# Patient Record
Sex: Male | Born: 1970
Health system: Southern US, Community
[De-identification: ages and names within clinical notes are randomized; demographics above are authoritative.]

## PROBLEM LIST (undated history)

## (undated) DIAGNOSIS — I10 Essential (primary) hypertension: Secondary | ICD-10-CM

## (undated) DIAGNOSIS — E785 Hyperlipidemia, unspecified: Secondary | ICD-10-CM

## (undated) DIAGNOSIS — E119 Type 2 diabetes mellitus without complications: Secondary | ICD-10-CM

## (undated) HISTORY — DX: Hyperlipidemia, unspecified: E78.5

## (undated) HISTORY — DX: Essential (primary) hypertension: I10

---

## 1998-12-20 ENCOUNTER — Emergency Department (HOSPITAL_COMMUNITY): Admission: EM | Admit: 1998-12-20 | Discharge: 1998-12-20 | Payer: Self-pay | Admitting: Emergency Medicine

## 1998-12-20 ENCOUNTER — Encounter: Payer: Self-pay | Admitting: Emergency Medicine

## 2001-03-19 ENCOUNTER — Emergency Department (HOSPITAL_COMMUNITY): Admission: EM | Admit: 2001-03-19 | Discharge: 2001-03-20 | Payer: Self-pay | Admitting: Emergency Medicine

## 2003-03-21 ENCOUNTER — Emergency Department (HOSPITAL_COMMUNITY): Admission: EM | Admit: 2003-03-21 | Discharge: 2003-03-21 | Payer: Self-pay | Admitting: Emergency Medicine

## 2008-03-23 ENCOUNTER — Emergency Department (HOSPITAL_COMMUNITY): Admission: EM | Admit: 2008-03-23 | Discharge: 2008-03-23 | Payer: Self-pay | Admitting: Emergency Medicine

## 2008-11-08 ENCOUNTER — Emergency Department (HOSPITAL_COMMUNITY): Admission: EM | Admit: 2008-11-08 | Discharge: 2008-11-08 | Payer: Self-pay | Admitting: Emergency Medicine

## 2009-01-28 ENCOUNTER — Emergency Department (HOSPITAL_COMMUNITY): Admission: EM | Admit: 2009-01-28 | Discharge: 2009-01-28 | Payer: Self-pay | Admitting: Emergency Medicine

## 2011-02-16 LAB — URINE MICROSCOPIC-ADD ON

## 2011-02-16 LAB — COMPREHENSIVE METABOLIC PANEL
ALT: 20 U/L (ref 0–53)
AST: 21 U/L (ref 0–37)
Albumin: 3.9 g/dL (ref 3.5–5.2)
Alkaline Phosphatase: 136 U/L — ABNORMAL HIGH (ref 39–117)
BUN: 12 mg/dL (ref 6–23)
CO2: 24 mEq/L (ref 19–32)
Calcium: 9.1 mg/dL (ref 8.4–10.5)
Chloride: 104 mEq/L (ref 96–112)
Creatinine, Ser: 0.92 mg/dL (ref 0.4–1.5)
GFR calc Af Amer: >60 mL/min (ref >60)
GFR calc non Af Amer: >60 mL/min (ref >60)
Glucose, Bld: 117 mg/dL — ABNORMAL HIGH (ref 70–99)
Potassium: 3.7 mEq/L (ref 3.5–5.1)
Sodium: 139 mEq/L (ref 135–145)
Total Bilirubin: 0.6 mg/dL (ref 0.3–1.2)
Total Protein: 6.7 g/dL (ref 6.0–8.3)

## 2011-02-16 LAB — DIFFERENTIAL
Basophils Absolute: 0 10*3/uL (ref 0.0–0.1)
Basophils Relative: 0 % (ref 0–1)
Eosinophils Absolute: 0.3 10*3/uL (ref 0.0–0.7)
Eosinophils Relative: 3 % (ref 0–5)
Lymphocytes Relative: 27 % (ref 12–46)
Lymphs Abs: 2.4 10*3/uL (ref 0.7–4.0)
Monocytes Absolute: 0.4 10*3/uL (ref 0.1–1.0)
Monocytes Relative: 4 % (ref 3–12)
Neutro Abs: 5.8 10*3/uL (ref 1.7–7.7)
Neutrophils Relative %: 65 % (ref 43–77)

## 2011-02-16 LAB — MONONUCLEOSIS SCREEN: Mono Screen: NEGATIVE

## 2011-02-16 LAB — LIPASE, BLOOD: Lipase: 20 U/L (ref 11–59)

## 2011-02-16 LAB — URINALYSIS, ROUTINE W REFLEX MICROSCOPIC
Bilirubin Urine: NEGATIVE
Glucose, UA: NEGATIVE mg/dL
Hgb urine dipstick: NEGATIVE
Nitrite: NEGATIVE
Protein, ur: NEGATIVE mg/dL
Specific Gravity, Urine: 1.026 (ref 1.005–1.030)
Urobilinogen, UA: 1 mg/dL (ref 0.0–1.0)
pH: 6 (ref 5.0–8.0)

## 2011-02-16 LAB — CBC
HCT: 42.8 % (ref 39.0–52.0)
Hemoglobin: 14.5 g/dL (ref 13.0–17.0)
MCHC: 33.8 g/dL (ref 30.0–36.0)
MCV: 88.8 fL (ref 78.0–100.0)
Platelets: 277 10*3/uL (ref 150–400)
RBC: 4.82 MIL/uL (ref 4.22–5.81)
RDW: 13.8 % (ref 11.5–15.5)
WBC: 8.9 10*3/uL (ref 4.0–10.5)

## 2017-08-27 ENCOUNTER — Emergency Department (HOSPITAL_BASED_OUTPATIENT_CLINIC_OR_DEPARTMENT_OTHER)
Admission: EM | Admit: 2017-08-27 | Discharge: 2017-08-27 | Disposition: A | Discharge status: Home / Self Care | Payer: Self-pay | Attending: Emergency Medicine | Admitting: Emergency Medicine

## 2017-08-27 ENCOUNTER — Encounter (HOSPITAL_BASED_OUTPATIENT_CLINIC_OR_DEPARTMENT_OTHER): Payer: Self-pay | Admitting: Emergency Medicine

## 2017-08-27 DIAGNOSIS — L03317 Cellulitis of buttock: Secondary | ICD-10-CM | POA: Insufficient documentation

## 2017-08-27 DIAGNOSIS — F17200 Nicotine dependence, unspecified, uncomplicated: Secondary | ICD-10-CM | POA: Insufficient documentation

## 2017-08-27 DIAGNOSIS — E119 Type 2 diabetes mellitus without complications: Secondary | ICD-10-CM | POA: Insufficient documentation

## 2017-08-27 HISTORY — DX: Type 2 diabetes mellitus without complications: E11.9

## 2017-08-27 MED ORDER — DOXYCYCLINE HYCLATE 100 MG PO CAPS: 100.0000 mg | ORAL_CAPSULE | Freq: Two times a day (BID) | ORAL | 0 refills | Status: DC

## 2017-08-27 MED FILL — DOXYCYCLINE HYCLATE 100 MG: 100 | 7 days supply | Qty: 14 | Fill #0

## 2017-08-27 NOTE — ED Triage Notes (Signed)
Pt has abscess to left buttocks for several days.  No known fever at home.

## 2017-08-27 NOTE — ED Provider Notes (Signed)
[facility] EMERGENCY DEPARTMENT Provider Note   CSN: [id number] Arrival date & time: 08/27/17  8295     History   Chief Complaint Chief Complaint  Patient presents with  . Abscess    HPI Larry Moody is a 46 y.o. male.  HPI 46 year old Caucasian male past medical history significant for diabetes presents to the ED for evaluation of abscess to left buttocks.  The patient states that the area started approximately 3 days ago and has become more painful and grown in size with associated erythema and warmth.  Patient states he may have been bitten by a spider.  Patient denies any purulent drainage.  Area is tender to touch.  Has been treating with Motrin and Tylenol with relief.  Also states that he put salve on it to help bring it to a "head".  Patient denies any associated symptoms of fever, chills, abdominal pain, nausea, emesis, pain with defecation, hematochezia.  No history of abscesses.  Patient does report being diagnosed with diabetes approximately 6 years ago but it was taken off his medication approximately 5 years ago. Past Medical History:  Diagnosis Date  . Diabetes mellitus without complication (HCC)     There are no active problems to display for this patient.   No past surgical history on file.     Home Medications    Prior to Admission medications   Not on File    Family History No family history on file.  Social History Social History  Substance Use Topics  . Smoking status: Current Every Day Smoker  . Smokeless tobacco: Never Used  . Alcohol use Not on file     Allergies   Other   Review of Systems Review of Systems  Constitutional: Negative for chills and fever.  Gastrointestinal: Negative for abdominal pain, blood in stool, nausea, rectal pain and vomiting.  Musculoskeletal: Positive for myalgias.  Skin: Positive for wound.     Physical Exam Updated Vital Signs BP (!) 147/87 (BP Location: Left Arm)   Pulse (!) 110    Temp 99.1 F (37.3 C) (Oral)   Resp 20   Ht 6\' 3"  (1.905 m)   Wt (!) 142.9 kg (315 lb)   SpO2 98%   BMI 39.37 kg/m   Physical Exam  Constitutional: He appears well-developed and well-nourished. No distress.  HENT:  Head: Normocephalic and atraumatic.  Eyes: Right eye exhibits no discharge. Left eye exhibits no discharge. No scleral icterus.  Neck: Normal range of motion.  Pulmonary/Chest: No respiratory distress.  Abdominal: Soft. There is no rebound and no guarding.  Musculoskeletal: Normal range of motion.  Neurological: He is alert.  Skin: Skin is warm and dry. Capillary refill takes less than 2 seconds. No pallor.  Patient with approximately 4 x 4 centimeter area of erythema, warmth and induration to the left buttocks.  There is a central area of ulceration but no area of fluctuance.  No purulent drainage.  Area is mildly tender to touch.  There is no rectal involvement or perirectal involvement.  No crepitus noted.  No scrotal involvement.  Psychiatric: His behavior is normal. Judgment and thought content normal.  Nursing note and vitals reviewed.    ED Treatments / Results  Labs (all labs ordered are listed, but only abnormal results are displayed) Labs Reviewed - No data to display  EKG  EKG Interpretation None       Radiology No results found.  Procedures Procedures (including critical care time)  Medications Ordered  in ED Medications - No data to display   Initial Impression / Assessment and Plan / ED Course  I have reviewed the triage vital signs and the nursing notes.  Pertinent labs & imaging results that were available during my care of the patient were reviewed by me and considered in my medical decision making (see chart for details).     Patient presentation consistent with cellulitis.  Patient concerned that he may be have bitten by spider.  Afebrile however low grade temperature. Mild tachycardia. No hypotension or other symptoms suggestive of  severe infection.  Patient does have a history of diabetes but states he was taken off his medication 5 years ago.  Bedside ultrasound was used that showed no area of drainable abscess at this time.  Patient denies any associated systemic symptoms of nausea, emesis, abdominal pain, fever.  Patient denies any pain with defecation.  There seems to be no perirectal or rectal involvement L be concerning for perirectal abscess.  pt advised to follow up for wound check in 2-3 days, sooner for worsening systemic symptoms, new lymphangitis, or significant spread of erythema past line of demarcation. Will discharge with doxycyline. Return precautions discussed. Pt appears safe for discharge.    Final Clinical Impressions(s) / ED Diagnoses   Final diagnoses:  Cellulitis of buttock    New Prescriptions Discharge Medication List as of 08/27/2017  9:50 AM    START taking these medications   Details  doxycycline (VIBRAMYCIN) 100 MG capsule Take 1 capsule (100 mg total) by mouth 2 (two) times daily., Starting Fri 08/27/2017, Print         Rise MuLeaphart, Allyson Tineo T, PA-C 08/27/17 [id number]    Vanetta MuldersZackowski, Scott, MD 08/31/17 [phone]

## 2017-08-27 NOTE — Discharge Instructions (Signed)
You have been seen today in the Emergency Department for cellulitis, a superficial skin infection. Please take your antibiotics as prescribed for their ENTIRE prescribed duration.   May alternate between motrin and tylenol for fever, pain, and swelling.  Please follow up with your doctor or return to the ER in 2 days for recheck of your infection if you are not improving.  Call your doctor sooner or return to the ER if you develop worsening signs of infection such as: increased redness, increased pain, pus, fever, or other symptoms that concern you.  

## 2017-08-27 NOTE — ED Notes (Signed)
ED Provider at bedside. 

## 2018-12-30 ENCOUNTER — Ambulatory Visit: Payer: Self-pay | Admitting: Family Medicine

## 2019-05-30 ENCOUNTER — Encounter: Payer: Self-pay | Admitting: Registered Nurse

## 2019-05-30 ENCOUNTER — Other Ambulatory Visit: Payer: Self-pay

## 2019-05-30 ENCOUNTER — Ambulatory Visit (INDEPENDENT_AMBULATORY_CARE_PROVIDER_SITE_OTHER): Payer: BLUE CROSS/BLUE SHIELD | Admitting: Registered Nurse

## 2019-05-30 VITALS — BP 140/90 | HR 94 | Temp 99.0°F | Resp 13 | Ht 73.82 in | Wt 312.0 lb

## 2019-05-30 DIAGNOSIS — Z130 Encounter for screening for diseases of the blood and blood-forming organs and certain disorders involving the immune mechanism: Secondary | ICD-10-CM | POA: Diagnosis not present

## 2019-05-30 DIAGNOSIS — Z13220 Encounter for screening for lipoid disorders: Secondary | ICD-10-CM

## 2019-05-30 DIAGNOSIS — Z1329 Encounter for screening for other suspected endocrine disorder: Secondary | ICD-10-CM | POA: Diagnosis not present

## 2019-05-30 DIAGNOSIS — Z0001 Encounter for general adult medical examination with abnormal findings: Secondary | ICD-10-CM

## 2019-05-30 DIAGNOSIS — Z13228 Encounter for screening for other metabolic disorders: Secondary | ICD-10-CM

## 2019-05-30 DIAGNOSIS — E119 Type 2 diabetes mellitus without complications: Secondary | ICD-10-CM

## 2019-05-30 MED ORDER — METFORMIN HCL 500 MG PO TABS
500.0000 mg | ORAL_TABLET | Freq: Two times a day (BID) | ORAL | 0 refills | Status: DC
Start: 2019-05-30 — End: 2019-07-14

## 2019-05-30 NOTE — Patient Instructions (Signed)
° ° ° °  If you have lab work done today you will be contacted with your lab results within the next 2 weeks.  If you have not heard from us then please contact us. The fastest way to get your results is to register for My Chart. ° ° °IF you received an x-ray today, you will receive an invoice from [facility]. Please contact [facility] at [phone] with questions or concerns regarding your invoice.  ° °IF you received labwork today, you will receive an invoice from [facility]. Please contact [facility] at [phone] with questions or concerns regarding your invoice.  ° °Our billing staff will not be able to assist you with questions regarding bills from these companies. ° °You will be contacted with the lab results as soon as they are available. The fastest way to get your results is to activate your My Chart account. Instructions are located on the last page of this paperwork. If you have not heard from us regarding the results in 2 weeks, please contact this office. °  ° ° ° °

## 2019-05-30 NOTE — Progress Notes (Signed)
New Patient Office Visit  Subjective:  Patient ID: Larry Moody, male    DOB: [birth year]  Age: 48 y.o. MRN: [id number]  CC:  Chief Complaint  Patient presents with  . Establish Care    HPI DECOREY Moody presents for visit to establish care.  He has a history of T2DM, but after losing 150lbs, was able to stop his medication. Unfortunately, after that, he was lost to follow up and has not been seen by a PCP since. He hopes to reestablish care with lab work and physical today.  He feels overall fine, but has been taking his blood sugar at home and states that it usually ranges from 180-230, with highs of around 370.   Has recently experienced weight gain - has only been working 1 of his 2 jobs d/t COVID.  Past Medical History:  Diagnosis Date  . Diabetes mellitus without complication ([place])   . Hypertension     History reviewed. No pertinent surgical history.  Family History  Problem Relation Age of Onset  . Hypertension Mother   . Hypertension Father   . Diabetes Brother     Social History   Socioeconomic History  . Marital status: Married    Spouse name: Not on file  . Number of children: 6  . Years of education: Not on file  . Highest education level: Not on file  Occupational History  . Not on file  Social Needs  . Financial resource strain: Not hard at all  . Food insecurity    Worry: Never true    Inability: Never true  . Transportation needs    Medical: No    Non-medical: No  Tobacco Use  . Smoking status: Current Every Day Smoker    Packs/day: 0.50  . Smokeless tobacco: Never Used  Substance and Sexual Activity  . Alcohol use: Yes  . Drug use: Not Currently  . Sexual activity: Yes  Lifestyle  . Physical activity    Days per week: Patient refused    Minutes per session: Patient refused  . Stress: To some extent  Relationships  . Social connections    Talks on phone: More than three times a week    Gets together: Three times a week   Attends religious service: Patient refused    Active member of club or organization: Patient refused    Attends meetings of clubs or organizations: Patient refused    Relationship status: Married  . Intimate partner violence    Fear of current or ex partner: No    Emotionally abused: No    Physically abused: No    Forced sexual activity: No  Other Topics Concern  . Not on file  Social History Narrative  . Not on file    ROS Review of Systems  Constitutional: Positive for fatigue. Negative for unexpected weight change.  HENT: Negative.   Eyes: Negative.  Negative for visual disturbance.  Respiratory: Negative.  Negative for cough, chest tightness and shortness of breath.   Cardiovascular: Negative.  Negative for chest pain, palpitations and leg swelling.  Gastrointestinal: Negative.  Negative for constipation, diarrhea, nausea and vomiting.  Endocrine: Negative.   Genitourinary: Negative.   Musculoskeletal: Negative.  Negative for arthralgias and myalgias.  Skin: Negative.  Negative for color change, pallor, rash and wound.  Allergic/Immunologic: Negative.   Neurological: Negative.  Negative for weakness, numbness and headaches.  Hematological: Negative.   Psychiatric/Behavioral: Negative.   All other systems reviewed and are negative.  Objective:   Today's Vitals: BP 140/90   Pulse 94   Temp 99 F (37.2 C) (Oral)   Resp 13   Ht 6' 1.82" (1.875 m)   Wt (!) 312 lb (141.5 kg)   SpO2 97%   BMI 40.26 kg/m   Physical Exam Vitals signs and nursing note reviewed.  Constitutional:      General: He is not in acute distress.    Appearance: Normal appearance. He is normal weight. He is not ill-appearing, toxic-appearing or diaphoretic.  HENT:     Head: Normocephalic and atraumatic.     Right Ear: Tympanic membrane, ear canal and external ear normal. There is no impacted cerumen.     Left Ear: Tympanic membrane, ear canal and external ear normal. There is no impacted  cerumen.     Nose: Nose normal. No congestion or rhinorrhea.     Mouth/Throat:     Mouth: Mucous membranes are dry.     Pharynx: Oropharynx is clear. No oropharyngeal exudate or posterior oropharyngeal erythema.  Eyes:     General: No scleral icterus.       Right eye: No discharge.        Left eye: No discharge.     Extraocular Movements: Extraocular movements intact.     Conjunctiva/sclera: Conjunctivae normal.     Pupils: Pupils are equal, round, and reactive to light.  Neck:     Musculoskeletal: Normal range of motion.  Cardiovascular:     Rate and Rhythm: Normal rate and regular rhythm.     Pulses: Normal pulses.     Heart sounds: Normal heart sounds. No murmur. No friction rub. No gallop.   Pulmonary:     Effort: Pulmonary effort is normal. No respiratory distress.     Breath sounds: Normal breath sounds. No stridor. No wheezing, rhonchi or rales.  Chest:     Chest wall: No tenderness.  Abdominal:     General: Bowel sounds are normal.     Palpations: Abdomen is soft.  Musculoskeletal: Normal range of motion.        General: No swelling, tenderness, deformity or signs of injury.     Right lower leg: No edema.     Left lower leg: No edema.  Skin:    General: Skin is warm and dry.     Capillary Refill: Capillary refill takes less than 2 seconds.     Coloration: Skin is not jaundiced or pale.     Findings: No bruising, erythema, lesion or rash.  Neurological:     General: No focal deficit present.     Mental Status: He is alert and oriented to person, place, and time. Mental status is at baseline.     Cranial Nerves: No cranial nerve deficit.     Sensory: No sensory deficit.     Motor: No weakness.     Coordination: Coordination normal.  Psychiatric:        Mood and Affect: Mood normal.        Behavior: Behavior normal.        Thought Content: Thought content normal.        Judgment: Judgment normal.     Assessment & Plan:   Problem List Items Addressed This Visit     None    Visit Diagnoses    Screening for endocrine, metabolic and immunity disorder    -  Primary   Relevant Orders   TSH   CMP14+EGFR   Microalbumin, urine   HgB A1c  CBC with Differential   Type 2 diabetes mellitus without complication, without long-term current use of insulin (HCC)       Relevant Orders   TSH   CMP14+EGFR   Microalbumin, urine   HgB A1c   CBC with Differential   Lipid screening       Relevant Orders   Lipid panel      Outpatient Encounter Medications as of 05/30/2019  Medication Sig  . [DISCONTINUED] doxycycline (VIBRAMYCIN) 100 MG capsule Take 1 capsule (100 mg total) by mouth 2 (two) times daily.   No facility-administered encounter medications on file as of 05/30/2019.     Follow-up: No follow-ups on file.   PLAN:  Unfortunately, A1c POCT was 11.0% today. We will start Metformin 512m PO bid with meals for 6 weeks and then follow up. At that point, depending on tolerance, we can continue or switch to a different agent.  Otherwise, labs were drawn. Will follow as warranted.  Discussed nonpharm lifestyle changes to help with [id number] No complications at this time. Will closely monitor. Has not had eye exam. Referral sent.  Patient encouraged to call clinic with any questions, comments, or concerns.    RMaximiano Coss NP

## 2019-05-31 LAB — CMP14+EGFR
ALT: 13 IU/L (ref 0–44)
AST: 14 IU/L (ref 0–40)
Albumin/Globulin Ratio: 1.7 (ref 1.2–2.2)
Albumin: 4.5 g/dL (ref 4.0–5.0)
Alkaline Phosphatase: 179 IU/L — ABNORMAL HIGH (ref 39–117)
BUN/Creatinine Ratio: 14 (ref 9–20)
BUN: 14 mg/dL (ref 6–24)
Bilirubin Total: 0.2 mg/dL (ref 0.0–1.2)
CO2: 21 mmol/L (ref 20–29)
Calcium: 9.6 mg/dL (ref 8.7–10.2)
Chloride: 101 mmol/L (ref 96–106)
Creatinine, Ser: 0.99 mg/dL (ref 0.76–1.27)
GFR calc Af Amer: 103 mL/min/{1.73_m2} (ref >59)
GFR calc non Af Amer: 89 mL/min/{1.73_m2} (ref >59)
Globulin, Total: 2.6 g/dL (ref 1.5–4.5)
Glucose: 208 mg/dL — ABNORMAL HIGH (ref 65–99)
Potassium: 4 mmol/L (ref 3.5–5.2)
Sodium: 141 mmol/L (ref 134–144)
Total Protein: 7.1 g/dL (ref 6.0–8.5)

## 2019-05-31 LAB — CBC WITH DIFFERENTIAL/PLATELET
Basophils Absolute: 0 10*3/uL (ref 0.0–0.2)
Basos: 0 %
EOS (ABSOLUTE): 0.1 10*3/uL (ref 0.0–0.4)
Eos: 1 %
Hematocrit: 39.7 % (ref 37.5–51.0)
Hemoglobin: 13.4 g/dL (ref 13.0–17.7)
Immature Grans (Abs): 0 10*3/uL (ref 0.0–0.1)
Immature Granulocytes: 0 %
Lymphocytes Absolute: 2.8 10*3/uL (ref 0.7–3.1)
Lymphs: 34 %
MCH: 28.9 pg (ref 26.6–33.0)
MCHC: 33.8 g/dL (ref 31.5–35.7)
MCV: 86 fL (ref 79–97)
Monocytes Absolute: 0.4 10*3/uL (ref 0.1–0.9)
Monocytes: 6 %
Neutrophils Absolute: 4.7 10*3/uL (ref 1.4–7.0)
Neutrophils: 59 %
Platelets: 299 10*3/uL (ref 150–450)
RBC: 4.63 x10E6/uL (ref 4.14–5.80)
RDW: 13.1 % (ref 11.6–15.4)
WBC: 8.1 10*3/uL (ref 3.4–10.8)

## 2019-05-31 LAB — LIPID PANEL
Chol/HDL Ratio: 9.5 ratio — ABNORMAL HIGH (ref 0.0–5.0)
Cholesterol, Total: 285 mg/dL — ABNORMAL HIGH (ref 100–199)
HDL: 30 mg/dL — ABNORMAL LOW (ref >39)
LDL Calculated: 203 mg/dL — ABNORMAL HIGH (ref 0–99)
Triglycerides: 259 mg/dL — ABNORMAL HIGH (ref 0–149)
VLDL Cholesterol Cal: 52 mg/dL — ABNORMAL HIGH (ref 5–40)

## 2019-05-31 LAB — MICROALBUMIN, URINE: Microalbumin, Urine: 31.3 ug/mL

## 2019-05-31 LAB — TSH: TSH: 1.87 u[IU]/mL (ref 0.450–4.500)

## 2019-06-02 ENCOUNTER — Encounter: Payer: Self-pay | Admitting: Registered Nurse

## 2019-06-02 DIAGNOSIS — E785 Hyperlipidemia, unspecified: Secondary | ICD-10-CM

## 2019-06-02 MED ORDER — ATORVASTATIN CALCIUM 20 MG PO TABS: 20.0000 mg | ORAL_TABLET | Freq: Every day | ORAL | 3 refills | Status: DC

## 2019-06-02 NOTE — Progress Notes (Signed)
Left VM and sent letter with results. Overall, labs are as expected  Kathrin Ruddy, nP

## 2019-06-02 NOTE — Progress Notes (Signed)
Letter to pt with results Left VM as well. Will start atorvastatin 40mg  PO qd Will recheck labs again at visit in September.  Kathrin Ruddy, NP

## 2019-07-14 ENCOUNTER — Other Ambulatory Visit: Payer: Self-pay

## 2019-07-14 ENCOUNTER — Ambulatory Visit (INDEPENDENT_AMBULATORY_CARE_PROVIDER_SITE_OTHER): Payer: BLUE CROSS/BLUE SHIELD | Admitting: Registered Nurse

## 2019-07-14 ENCOUNTER — Encounter: Payer: Self-pay | Admitting: Registered Nurse

## 2019-07-14 VITALS — BP 152/82 | HR 99 | Temp 99.0°F | Resp 16 | Wt 317.0 lb

## 2019-07-14 DIAGNOSIS — E119 Type 2 diabetes mellitus without complications: Secondary | ICD-10-CM

## 2019-07-14 DIAGNOSIS — Z13220 Encounter for screening for lipoid disorders: Secondary | ICD-10-CM | POA: Diagnosis not present

## 2019-07-14 DIAGNOSIS — E785 Hyperlipidemia, unspecified: Secondary | ICD-10-CM | POA: Diagnosis not present

## 2019-07-14 LAB — POCT GLYCOSYLATED HEMOGLOBIN (HGB A1C): Hemoglobin A1C: 9.2 % — AB (ref 4.0–5.6)

## 2019-07-14 MED ORDER — ATORVASTATIN CALCIUM 20 MG PO TABS: 20.0000 mg | ORAL_TABLET | Freq: Every day | ORAL | 1 refills | Status: DC

## 2019-07-14 MED ORDER — METFORMIN HCL 500 MG PO TABS: 500.0000 mg | ORAL_TABLET | Freq: Two times a day (BID) | ORAL | 1 refills | Status: DC

## 2019-07-14 NOTE — Progress Notes (Signed)
Established Patient Office Visit  Subjective:  Patient ID: Larry Moody, male    DOB: 1971-10-02  Age: 48 y.o. MRN: [id number]  CC:  Chief Complaint  Patient presents with  . Diabetes    6 week follow-up   . Hyperlipidemia    HPI Larry Moody presents for 3 mo recheck on T2DM. He was seen 3 mos ago to establish care when his A1c had unfortunately risen to 11.0%. We started him on metformin 500mg  PO bid with meals. He has also started taking atorvastatin daily for HLD.  He states that over the past three months he has been conscious of his diet and has felt that his weight loss has somewhat plateaued. He is facing a lot of stress at work and states it is often difficult to stick to a routine.  He states that he sometimes has some tingling in his 3,4,5 toes. This is usually after work when he is able to lie down. We discussed effects of HTN vs cigarette smoking vs T2DM neuropathy, he is due for Podiatry exam, will send referral.    Past Medical History:  Diagnosis Date  . Diabetes mellitus without complication ([place])   . Hypertension     History reviewed. No pertinent surgical history.  Family History  Problem Relation Age of Onset  . Hypertension Mother   . Hypertension Father   . Diabetes Brother     Social History   Socioeconomic History  . Marital status: Married    Spouse name: Not on file  . Number of children: 6  . Years of education: Not on file  . Highest education level: Not on file  Occupational History  . Not on file  Social Needs  . Financial resource strain: Not hard at all  . Food insecurity    Worry: Never true    Inability: Never true  . Transportation needs    Medical: No    Non-medical: No  Tobacco Use  . Smoking status: Current Every Day Smoker    Packs/day: 0.50  . Smokeless tobacco: Never Used  Substance and Sexual Activity  . Alcohol use: Yes  . Drug use: Not Currently  . Sexual activity: Yes  Lifestyle  . Physical activity   Days per week: Patient refused    Minutes per session: Patient refused  . Stress: To some extent  Relationships  . Social connections    Talks on phone: More than three times a week    Gets together: Three times a week    Attends religious service: Patient refused    Active member of club or organization: Patient refused    Attends meetings of clubs or organizations: Patient refused    Relationship status: Married  . Intimate partner violence    Fear of current or ex partner: No    Emotionally abused: No    Physically abused: No    Forced sexual activity: No  Other Topics Concern  . Not on file  Social History Narrative  . Not on file    Outpatient Medications Prior to Visit  Medication Sig Dispense Refill  . atorvastatin (LIPITOR) 20 MG tablet Take 1 tablet (20 mg total) by mouth daily. 90 tablet 3  . metFORMIN (GLUCOPHAGE) 500 MG tablet Take 1 tablet (500 mg total) by mouth 2 (two) times daily with a meal. 180 tablet 0   No facility-administered medications prior to visit.     Allergies  Allergen Reactions  . Other Nausea And Vomiting  Green peas, watermelon, liver    ROS Review of Systems  Constitutional: Negative.   HENT: Negative.   Eyes: Negative.   Respiratory: Negative.   Cardiovascular: Negative.   Gastrointestinal: Negative.   Endocrine: Negative.   Genitourinary: Negative.   Musculoskeletal: Negative.   Skin: Negative.   Allergic/Immunologic: Negative.   Neurological: Negative.   Hematological: Negative.   Psychiatric/Behavioral: Negative.   All other systems reviewed and are negative.     Objective:    Physical Exam  Constitutional: He is oriented to person, place, and time. He appears well-developed and well-nourished. No distress.  Cardiovascular: Normal rate and regular rhythm.  Pulmonary/Chest: Effort normal. No respiratory distress.  Neurological: He is alert and oriented to person, place, and time.  Skin: Skin is warm and dry. No rash  noted. He is not diaphoretic. No erythema. No pallor.  Psychiatric: He has a normal mood and affect. His behavior is normal. Judgment and thought content normal.  Nursing note and vitals reviewed.   BP (!) 152/82   Pulse 99   Temp 99 F (37.2 C) (Oral)   Resp 16   Wt (!) 317 lb (143.8 kg)   SpO2 98%   BMI 40.90 kg/m  Wt Readings from Last 3 Encounters:  07/14/19 (!) 317 lb (143.8 kg)  05/30/19 (!) 312 lb (141.5 kg)  08/27/17 (!) 315 lb (142.9 kg)     Health Maintenance Due  Topic Date Due  . HEMOGLOBIN A1C  07/06/69  . PNEUMOCOCCAL POLYSACCHARIDE VACCINE AGE 53-64 HIGH RISK  07/17/1971  . FOOT EXAM  07/17/1979  . OPHTHALMOLOGY EXAM  07/17/1979  . HIV Screening  07/16/1984  . TETANUS/TDAP  07/16/1988  . INFLUENZA VACCINE  06/03/2019    There are no preventive care reminders to display for this patient.  Lab Results  Component Value Date   TSH 1.870 05/30/2019   Lab Results  Component Value Date   WBC 8.1 05/30/2019   HGB 13.4 05/30/2019   HCT 39.7 05/30/2019   MCV 86 05/30/2019   PLT 299 05/30/2019   Lab Results  Component Value Date   NA 141 05/30/2019   K 4.0 05/30/2019   CO2 21 05/30/2019   GLUCOSE 208 (H) 05/30/2019   BUN 14 05/30/2019   CREATININE 0.99 05/30/2019   BILITOT 0.2 05/30/2019   ALKPHOS 179 (H) 05/30/2019   AST 14 05/30/2019   ALT 13 05/30/2019   PROT 7.1 05/30/2019   ALBUMIN 4.5 05/30/2019   CALCIUM 9.6 05/30/2019   Lab Results  Component Value Date   CHOL 285 (H) 05/30/2019   Lab Results  Component Value Date   HDL 30 (L) 05/30/2019   Lab Results  Component Value Date   LDLCALC 203 (H) 05/30/2019   Lab Results  Component Value Date   TRIG 259 (H) 05/30/2019   Lab Results  Component Value Date   CHOLHDL 9.5 (H) 05/30/2019   Lab Results  Component Value Date   HGBA1C 9.2 (A) 07/14/2019      Assessment & Plan:   Problem List Items Addressed This Visit      Endocrine   Type 2 diabetes mellitus (HCC) - Primary    Relevant Orders   POCT glycosylated hemoglobin (Hb A1C) (Completed)   Comprehensive metabolic panel   Ambulatory referral to Podiatry    Other Visit Diagnoses    Lipid screening       Relevant Orders   Lipid panel      Meds ordered this encounter  Medications  . atorvastatin (LIPITOR) 20 MG tablet    Sig: Take 1 tablet (20 mg total) by mouth daily.    Dispense:  90 tablet    Refill:  1    Order Specific Question:   Supervising Provider    Answer:   Collie Siad A [id number]  . metFORMIN (GLUCOPHAGE) 500 MG tablet    Sig: Take 1 tablet (500 mg total) by mouth 2 (two) times daily with a meal.    Dispense:  180 tablet    Refill:  1    Order Specific Question:   Supervising Provider    Answer:   Doristine Bosworth [id number]    Follow-up: Return in 3 months (on 10/13/2019).    Janeece Agee, NP

## 2019-07-14 NOTE — Patient Instructions (Addendum)
If you have lab work done today you will be contacted with your lab results within the next 2 weeks.  If you have not heard from [place] then please contact us. The fastest way to get your results is to register for My Chart.   IF you received an x-ray today, you will receive an invoice from [facility]. Please contact [facility] at [phone] with questions or concerns regarding your invoice.   IF you received labwork today, you will receive an invoice from [facility]. Please contact [facility] at [phone] with questions or concerns regarding your invoice.   Our billing staff will not be able to assist you with questions regarding bills from these companies.  You will be contacted with the lab results as soon as they are available. The fastest way to get your results is to activate your My Chart account. Instructions are located on the last page of this paperwork. If you have not heard from [place] regarding the results in 2 weeks, please contact this office.       High Cholesterol  High cholesterol is a condition in which the blood has high levels of a white, waxy, fat-like substance (cholesterol). The human body needs small amounts of cholesterol. The liver makes all the cholesterol that the body needs. Extra (excess) cholesterol comes from the food that we eat. Cholesterol is carried from the liver by the blood through the blood vessels. If you have high cholesterol, deposits (plaques) may build up on the walls of your blood vessels (arteries). Plaques make the arteries narrower and stiffer. Cholesterol plaques increase your risk for heart attack and stroke. Work with your health care provider to keep your cholesterol levels in a healthy range. What increases the risk? This condition is more likely to develop in people who:  Eat foods that are high in animal fat (saturated fat) or cholesterol.  Are overweight.  Are not getting enough exercise.  Have a family history  of high cholesterol. What are the signs or symptoms? There are no symptoms of this condition. How is this diagnosed? This condition may be diagnosed from the results of a blood test.  If you are older than age 17, your health care provider may check your cholesterol every 4-6 years.  You may be checked more often if you already have high cholesterol or other risk factors for heart disease. The blood test for cholesterol measures:  "Bad" cholesterol (LDL cholesterol). This is the main type of cholesterol that causes heart disease. The desired level for LDL is less than 100.  "Good" cholesterol (HDL cholesterol). This type helps to protect against heart disease by cleaning the arteries and carrying the LDL away. The desired level for HDL is 60 or higher.  Triglycerides. These are fats that the body can store or burn for energy. The desired number for triglycerides is lower than 150.  Total cholesterol. This is a measure of the total amount of cholesterol in your blood, including LDL cholesterol, HDL cholesterol, and triglycerides. A healthy number is less than 200. How is this treated? This condition is treated with diet changes, lifestyle changes, and medicines. Diet changes  This may include eating more whole grains, fruits, vegetables, nuts, and fish.  This may also include cutting back on red meat and foods that have a lot of added sugar. Lifestyle changes  Changes may include getting at least 40 minutes of aerobic exercise 3 times a week. Aerobic exercises include walking, biking, and swimming. Aerobic  exercise along with a healthy diet can help you maintain a healthy weight.  Changes may also include quitting smoking. Medicines  Medicines are usually given if diet and lifestyle changes have failed to reduce your cholesterol to healthy levels.  Your health care provider may prescribe a statin medicine. Statin medicines have been shown to reduce cholesterol, which can reduce the  risk of heart disease. Follow these instructions at home: Eating and drinking If told by your health care provider:  Eat chicken (without skin), fish, veal, shellfish, ground [place] breast, and round or loin cuts of red meat.  Do not eat fried foods or fatty meats, such as hot dogs and salami.  Eat plenty of fruits, such as apples.  Eat plenty of vegetables, such as broccoli, potatoes, and carrots.  Eat beans, peas, and lentils.  Eat grains such as barley, rice, couscous, and bulgur wheat.  Eat pasta without cream sauces.  Use skim or nonfat milk, and eat low-fat or nonfat yogurt and cheeses.  Do not eat or drink whole milk, cream, ice cream, egg yolks, or hard cheeses.  Do not eat stick margarine or tub margarines that contain trans fats (also called partially hydrogenated oils).  Do not eat saturated tropical oils, such as coconut oil and palm oil.  Do not eat cakes, cookies, crackers, or other baked goods that contain trans fats.  General instructions  Exercise as directed by your health care provider. Increase your activity level with activities such as gardening, walking, and taking the stairs.  Take over-the-counter and prescription medicines only as told by your health care provider.  Do not use any products that contain nicotine or tobacco, such as cigarettes and e-cigarettes. If you need help quitting, ask your health care provider.  Keep all follow-up visits as told by your health care provider. This is important. Contact a health care provider if:  You are struggling to maintain a healthy diet or weight.  You need help to start on an exercise program.  You need help to stop smoking. Get help right away if:  You have chest pain.  You have trouble breathing. This information is not intended to replace advice given to you by your health care provider. Make sure you discuss any questions you have with your health care provider. Document Released: 10/19/2005  Document Revised: 10/22/2017 Document Reviewed: 04/18/2016 Elsevier Patient Education  2020 [employer]     Why follow it? Research shows. . Those who follow the Mediterranean diet have a reduced risk of heart disease  . The diet is associated with a reduced incidence of Parkinson's and Alzheimer's diseases . People following the diet may have longer life expectancies and lower rates of chronic diseases  . The Dietary Guidelines for Americans recommends the Mediterranean diet as an eating plan to promote health and prevent disease  What Is the Mediterranean Diet?  . Healthy eating plan based on typical foods and recipes of Mediterranean-style cooking . The diet is primarily a plant based diet; these foods should make up a majority of meals   Starches - Plant based foods should make up a majority of meals - They are an important sources of vitamins, minerals, energy, antioxidants, and fiber - Choose whole grains, foods high in fiber and minimally processed items  - Typical grain sources include wheat, oats, barley, corn, brown rice, bulgar, farro, millet, polenta, couscous  - Various types of beans include chickpeas, lentils, fava beans, black beans, white beans   Fruits  Veggies -  Large quantities of antioxidant rich fruits & veggies; 6 or more servings  - Vegetables can be eaten raw or lightly drizzled with oil and cooked  - Vegetables common to the traditional Mediterranean Diet include: artichokes, arugula, beets, broccoli, brussel sprouts, cabbage, carrots, celery, collard greens, cucumbers, eggplant, kale, leeks, lemons, lettuce, mushrooms, okra, onions, peas, peppers, potatoes, pumpkin, radishes, rutabaga, shallots, spinach, sweet potatoes, turnips, zucchini - Fruits common to the Mediterranean Diet include: apples, apricots, avocados, cherries, clementines, dates, figs, grapefruits, grapes, melons, nectarines, oranges, peaches, pears, pomegranates, strawberries, tangerines  Fats -  Replace butter and margarine with healthy oils, such as olive oil, canola oil, and tahini  - Limit nuts to no more than a handful a day  - Nuts include walnuts, almonds, pecans, pistachios, pine nuts  - Limit or avoid candied, honey roasted or heavily salted nuts - Olives are central to the [employer]iterranean diet - can be eaten whole or used in a variety of dishes   Meats Protein - Limiting red meat: no more than a few times a month - When eating red meat: choose lean cuts and keep the portion to the size of deck of cards - Eggs: approx. 0 to 4 times a week  - Fish and lean poultry: at least 2 a week  - Healthy protein sources include, chicken, [place], lean beef, lamb - Increase intake of seafood such as tuna, salmon, trout, mackerel, shrimp, scallops - Avoid or limit high fat processed meats such as sausage and bacon  Dairy - Include moderate amounts of low fat dairy products  - Focus on healthy dairy such as fat free yogurt, skim milk, low or reduced fat cheese - Limit dairy products higher in fat such as whole or 2% milk, cheese, ice cream  Alcohol - Moderate amounts of red wine is ok  - No more than 5 oz daily for women (all ages) and men older than age 50  - No more than 10 oz of wine daily for men younger than 2965  Other - Limit sweets and other desserts  - Use herbs and spices instead of salt to flavor foods  - Herbs and spices common to the traditional Mediterranean Diet include: basil, bay leaves, chives, cloves, cumin, fennel, garlic, lavender, marjoram, mint, oregano, parsley, pepper, rosemary, sage, savory, sumac, tarragon, thyme   It's not just a diet, it's a lifestyle:  . The Mediterranean diet includes lifestyle factors typical of those in the region  . Foods, drinks and meals are best eaten with others and savored . Daily physical activity is important for overall good health . This could be strenuous exercise like running and aerobics . This could also be more leisurely  activities such as walking, housework, yard-work, or taking the stairs . Moderation is the key; a balanced and healthy diet accommodates most foods and drinks . Consider portion sizes and frequency of consumption of certain foods   Meal Ideas & Options:  . Breakfast:  o Whole wheat toast or whole wheat English muffins with peanut butter & hard boiled egg o Steel cut oats topped with apples & cinnamon and skim milk  o Fresh fruit: banana, strawberries, melon, berries, peaches  o Smoothies: strawberries, bananas, greek yogurt, peanut butter o Low fat greek yogurt with blueberries and granola  o Egg white omelet with spinach and mushrooms o Breakfast couscous: whole wheat couscous, apricots, skim milk, cranberries  . Sandwiches:  o Hummus and grilled vegetables (peppers, zucchini, squash) on whole wheat bread  o Grilled chicken on whole wheat pita with lettuce, tomatoes, cucumbers or tzatziki  o Tuna salad on whole wheat bread: tuna salad made with greek yogurt, olives, red peppers, capers, green onions o Garlic rosemary lamb pita: lamb sauted with garlic, rosemary, salt & pepper; add lettuce, cucumber, greek yogurt to pita - flavor with lemon juice and black pepper  . Seafood:  o Mediterranean grilled salmon, seasoned with garlic, basil, parsley, lemon juice and black pepper o Shrimp, lemon, and spinach whole-grain pasta salad made with low fat greek yogurt  o Seared scallops with lemon orzo  o Seared tuna steaks seasoned salt, pepper, coriander topped with tomato mixture of olives, tomatoes, olive oil, minced garlic, parsley, green onions and cappers  . Meats:  o Herbed greek chicken salad with kalamata olives, cucumber, feta  o Red bell peppers stuffed with spinach, bulgur, lean ground beef (or lentils) & topped with feta   o Kebabs: skewers of chicken, tomatoes, onions, zucchini, squash  o [place] burgers: made with red onions, mint, dill, lemon juice, feta cheese topped with roasted red  peppers . Vegetarian o Cucumber salad: cucumbers, artichoke hearts, celery, red onion, feta cheese, tossed in olive oil & lemon juice  o Hummus and whole grain pita points with a greek salad (lettuce, tomato, feta, olives, cucumbers, red onion) o Lentil soup with celery, carrots made with vegetable broth, garlic, salt and pepper  o Tabouli salad: parsley, bulgur, mint, scallions, cucumbers, tomato, radishes, lemon juice, olive oil, salt and pepper.       Fat and Cholesterol Restricted Eating Plan Eating a diet that limits fat and cholesterol may help lower your risk for heart disease and other conditions. Your body needs fat and cholesterol for basic functions, but eating too much of these things can be harmful to your health. Your health care provider may order lab tests to check your blood fat (lipid) and cholesterol levels. This helps your health care provider understand your risk for certain conditions and whether you need to make diet changes. Work with your health care provider or dietitian to make an eating plan that is right for you. Your plan includes:  Limit your fat intake to ______% or less of your total calories a day.  Limit your saturated fat intake to ______% or less of your total calories a day.  Limit the amount of cholesterol in your diet to less than _________mg a day.  Eat ___________ g of fiber a day. What are tips for following this plan? General guidelines   If you are overweight, work with your health care provider to lose weight safely. Losing just 5-10% of your body weight can improve your overall health and help prevent diseases such as diabetes and heart disease.  Avoid: ? Foods with added sugar. ? Fried foods. ? Foods that contain partially hydrogenated oils, including stick margarine, some tub margarines, cookies, crackers, and other baked goods.  Limit alcohol intake to no more than 1 drink a day for nonpregnant women and 2 drinks a day for men. One  drink equals 12 oz of beer, 5 oz of wine, or 1 oz of hard liquor. Reading food labels  Check food labels for: ? Trans fats, partially hydrogenated oils, or high amounts of saturated fat. Avoid foods that contain saturated fat and trans fat. ? The amount of cholesterol in each serving. Try to eat no more than 200 mg of cholesterol each day. ? The amount of fiber in each serving. Try to eat  at least 20-30 g of fiber each day.  Choose foods with healthy fats, such as: ? Monounsaturated and polyunsaturated fats. These include olive and canola oil, flaxseeds, walnuts, almonds, and seeds. ? Omega-3 fats. These are found in foods such as salmon, mackerel, sardines, tuna, flaxseed oil, and ground flaxseeds.  Choose grain products that have whole grains. Look for the word "whole" as the first word in the ingredient list. Cooking  Cook foods using methods other than frying. Baking, boiling, grilling, and broiling are some healthy options.  Eat more home-cooked food and less restaurant, buffet, and fast food.  Avoid cooking using saturated fats. ? Animal sources of saturated fats include meats, butter, and cream. ? Plant sources of saturated fats include palm oil, palm kernel oil, and coconut oil. Meal planning   At meals, imagine dividing your plate into fourths: ? Fill one-half of your plate with vegetables and green salads. ? Fill one-fourth of your plate with whole grains. ? Fill one-fourth of your plate with lean protein foods.  Eat fish that is high in omega-3 fats at least two times a week.  Eat more foods that contain fiber, such as whole grains, beans, apples, broccoli, carrots, peas, and barley. These foods help promote healthy cholesterol levels in the blood. Recommended foods Grains  Whole grains, such as whole wheat or whole grain breads, crackers, cereals, and pasta. Unsweetened oatmeal, bulgur, barley, quinoa, or brown rice. Corn or whole wheat flour  tortillas. Vegetables  Fresh or frozen vegetables (raw, steamed, roasted, or grilled). Green salads. Fruits  All fresh, canned (in natural juice), or frozen fruits. Meats and other protein foods  Ground beef (85% or leaner), grass-fed beef, or beef trimmed of fat. Skinless chicken or [place]. Ground chicken or [place]. Pork trimmed of fat. All fish and seafood. Egg whites. Dried beans, peas, or lentils. Unsalted nuts or seeds. Unsalted canned beans. Natural nut butters without added sugar and oil. Dairy  Low-fat or nonfat dairy products, such as skim or 1% milk, 2% or reduced-fat cheeses, low-fat and fat-free ricotta or cottage cheese, or plain low-fat and nonfat yogurt. Fats and oils  Tub margarine without trans fats. Light or reduced-fat mayonnaise and salad dressings. Avocado. Olive, canola, sesame, or safflower oils. The items listed above may not be a complete list of recommended foods or beverages. Contact your dietitian for more options. Foods to avoid Grains  White bread. White pasta. White rice. Cornbread. Bagels, pastries, and croissants. Crackers and snack foods that contain trans fat and hydrogenated oils. Vegetables  Vegetables cooked in cheese, cream, or butter sauce. Fried vegetables. Fruits  Canned fruit in heavy syrup. Fruit in cream or butter sauce. Fried fruit. Meats and other protein foods  Fatty cuts of meat. Ribs, chicken wings, bacon, sausage, bologna, salami, chitterlings, fatback, hot dogs, bratwurst, and packaged lunch meats. Liver and organ meats. Whole eggs and egg yolks. Chicken and [place] with skin. Fried meat. Dairy  Whole or 2% milk, cream, half-and-half, and cream cheese. Whole milk cheeses. Whole-fat or sweetened yogurt. Full-fat cheeses. Nondairy creamers and whipped toppings. Processed cheese, cheese spreads, and cheese curds. Beverages  Alcohol. Sugar-sweetened drinks such as sodas, lemonade, and fruit drinks. Fats and oils  Butter, stick  margarine, lard, shortening, ghee, or bacon fat. Coconut, palm kernel, and palm oils. Sweets and desserts  Corn syrup, sugars, honey, and molasses. Candy. Jam and jelly. Syrup. Sweetened cereals. Cookies, pies, cakes, donuts, muffins, and ice cream. The items listed above may not be a complete  list of foods and beverages to avoid. Contact your dietitian for more information. Summary  Your body needs fat and cholesterol for basic functions. However, eating too much of these things can be harmful to your health.  Work with your health care provider and dietitian to follow a diet low in fat and cholesterol. Doing this may help lower your risk for heart disease and other conditions.  Choose healthy fats, such as monounsaturated and polyunsaturated fats, and foods high in omega-3 fatty acids.  Eat fiber-rich foods, such as whole grains, beans, peas, fruits, and vegetables.  Limit or avoid alcohol, fried foods, and foods high in saturated fats, partially hydrogenated oils, and sugar. This information is not intended to replace advice given to you by your health care provider. Make sure you discuss any questions you have with your health care provider. Document Released: 10/19/2005 Document Revised: 10/01/2017 Document Reviewed: 07/06/2017 Elsevier Patient Education  2020 [employer].  American Heart Association (AHA) Exercise Recommendation  Being physically active is important to prevent heart disease and stroke, the nation's No. 1and No. 5killers. To improve overall cardiovascular health, we suggest at least 150 minutes per week of moderate exercise or 75 minutes per week of vigorous exercise (or a combination of moderate and vigorous activity). Thirty minutes a day, five times a week is an easy goal to remember. You will also experience benefits even if you divide your time into two or three segments of 10 to 15 minutes per day.  For people who would benefit from lowering their blood pressure  or cholesterol, we recommend 40 minutes of aerobic exercise of moderate to vigorous intensity three to four times a week to lower the risk for heart attack and stroke.  Physical activity is anything that makes you move your body and burn calories.  This includes things like climbing stairs or playing sports. Aerobic exercises benefit your heart, and include walking, jogging, swimming or biking. Strength and stretching exercises are best for overall stamina and flexibility.  The simplest, positive change you can make to effectively improve your heart health is to start walking. It's enjoyable, free, easy, social and great exercise. A walking program is flexible and boasts high success rates because people can stick with it. It's easy for walking to become a regular and satisfying part of life.   For Overall Cardiovascular Health:  At least 30 minutes of moderate-intensity aerobic activity at least 5 days per week for a total of 150  OR   At least 25 minutes of vigorous aerobic activity at least 3 days per week for a total of 75 minutes; or a combination of moderate- and vigorous-intensity aerobic activity  AND   Moderate- to high-intensity muscle-strengthening activity at least 2 days per week for additional health benefits.  For Lowering Blood Pressure and Cholesterol  An average 40 minutes of moderate- to vigorous-intensity aerobic activity 3 or 4 times per week  What if I can't make it to the time goal? Something is always better than nothing! And everyone has to start somewhere. Even if you've been sedentary for years, today is the day you can begin to make healthy changes in your life. If you don't think you'll make it for 30 or 40 minutes, set a reachable goal for today. You can work up toward your overall goal by increasing your time as you get stronger. Don't let all-or-nothing thinking rob you of doing what you can every day.  Source:http://www.heart.org

## 2019-07-15 ENCOUNTER — Encounter: Payer: Self-pay | Admitting: Registered Nurse

## 2019-07-15 LAB — COMPREHENSIVE METABOLIC PANEL
ALT: 13 IU/L (ref 0–44)
AST: 12 IU/L (ref 0–40)
Albumin/Globulin Ratio: 1.7 (ref 1.2–2.2)
Albumin: 4 g/dL (ref 4.0–5.0)
Alkaline Phosphatase: 193 IU/L — ABNORMAL HIGH (ref 39–117)
BUN/Creatinine Ratio: 14 (ref 9–20)
BUN: 11 mg/dL (ref 6–24)
Bilirubin Total: <0.2 mg/dL (ref 0.0–1.2)
CO2: 23 mmol/L (ref 20–29)
Calcium: 9.1 mg/dL (ref 8.7–10.2)
Chloride: 103 mmol/L (ref 96–106)
Creatinine, Ser: 0.78 mg/dL (ref 0.76–1.27)
GFR calc Af Amer: 123 mL/min/{1.73_m2} (ref >59)
GFR calc non Af Amer: 106 mL/min/{1.73_m2} (ref >59)
Globulin, Total: 2.4 g/dL (ref 1.5–4.5)
Glucose: 190 mg/dL — ABNORMAL HIGH (ref 65–99)
Potassium: 3.8 mmol/L (ref 3.5–5.2)
Sodium: 139 mmol/L (ref 134–144)
Total Protein: 6.4 g/dL (ref 6.0–8.5)

## 2019-07-15 LAB — LIPID PANEL
Chol/HDL Ratio: 5.5 ratio — ABNORMAL HIGH (ref 0.0–5.0)
Cholesterol, Total: 180 mg/dL (ref 100–199)
HDL: 33 mg/dL — ABNORMAL LOW (ref >39)
LDL Chol Calc (NIH): 129 mg/dL — ABNORMAL HIGH (ref 0–99)
Triglycerides: 97 mg/dL (ref 0–149)
VLDL Cholesterol Cal: 18 mg/dL (ref 5–40)

## 2019-07-15 NOTE — Progress Notes (Signed)
Results letter sent to pt via mychart Mostly unchanged, lipids see improvement  Kathrin Ruddy, NP

## 2019-07-25 DIAGNOSIS — E119 Type 2 diabetes mellitus without complications: Secondary | ICD-10-CM | POA: Diagnosis not present

## 2019-07-25 DIAGNOSIS — H25043 Posterior subcapsular polar age-related cataract, bilateral: Secondary | ICD-10-CM | POA: Diagnosis not present

## 2019-08-24 DIAGNOSIS — H25043 Posterior subcapsular polar age-related cataract, bilateral: Secondary | ICD-10-CM | POA: Diagnosis not present

## 2019-08-24 DIAGNOSIS — E119 Type 2 diabetes mellitus without complications: Secondary | ICD-10-CM | POA: Diagnosis not present

## 2019-09-02 ENCOUNTER — Other Ambulatory Visit: Payer: Self-pay | Admitting: Registered Nurse

## 2019-09-02 DIAGNOSIS — E785 Hyperlipidemia, unspecified: Secondary | ICD-10-CM

## 2019-09-02 DIAGNOSIS — E119 Type 2 diabetes mellitus without complications: Secondary | ICD-10-CM

## 2019-09-20 ENCOUNTER — Ambulatory Visit: Payer: BLUE CROSS/BLUE SHIELD | Admitting: Podiatry

## 2019-10-09 ENCOUNTER — Encounter: Payer: Self-pay | Admitting: Podiatry

## 2019-10-09 ENCOUNTER — Ambulatory Visit (INDEPENDENT_AMBULATORY_CARE_PROVIDER_SITE_OTHER): Payer: BLUE CROSS/BLUE SHIELD | Admitting: Registered Nurse

## 2019-10-09 ENCOUNTER — Encounter: Payer: Self-pay | Admitting: Registered Nurse

## 2019-10-09 ENCOUNTER — Ambulatory Visit (INDEPENDENT_AMBULATORY_CARE_PROVIDER_SITE_OTHER): Payer: BLUE CROSS/BLUE SHIELD | Admitting: Podiatry

## 2019-10-09 ENCOUNTER — Other Ambulatory Visit: Payer: Self-pay

## 2019-10-09 VITALS — BP 168/109

## 2019-10-09 VITALS — BP 162/104 | HR 98 | Temp 98.6°F | Resp 14 | Ht 75.0 in | Wt 319.0 lb

## 2019-10-09 DIAGNOSIS — B351 Tinea unguium: Secondary | ICD-10-CM

## 2019-10-09 DIAGNOSIS — E119 Type 2 diabetes mellitus without complications: Secondary | ICD-10-CM

## 2019-10-09 DIAGNOSIS — M79674 Pain in right toe(s): Secondary | ICD-10-CM | POA: Diagnosis not present

## 2019-10-09 DIAGNOSIS — M79675 Pain in left toe(s): Secondary | ICD-10-CM

## 2019-10-09 DIAGNOSIS — E785 Hyperlipidemia, unspecified: Secondary | ICD-10-CM | POA: Diagnosis not present

## 2019-10-09 DIAGNOSIS — I10 Essential (primary) hypertension: Secondary | ICD-10-CM | POA: Diagnosis not present

## 2019-10-09 LAB — POCT GLYCOSYLATED HEMOGLOBIN (HGB A1C): Hemoglobin A1C: 9.9 % — AB (ref 4.0–5.6)

## 2019-10-09 MED ORDER — LOSARTAN POTASSIUM 50 MG PO TABS: 50.0000 mg | ORAL_TABLET | Freq: Every day | ORAL | 0 refills | Status: DC

## 2019-10-09 MED ORDER — METFORMIN HCL 500 MG PO TABS: 500.0000 mg | ORAL_TABLET | Freq: Two times a day (BID) | ORAL | 1 refills | Status: DC

## 2019-10-09 MED ORDER — ATORVASTATIN CALCIUM 20 MG PO TABS: 20.0000 mg | ORAL_TABLET | Freq: Every day | ORAL | 3 refills | Status: DC

## 2019-10-10 ENCOUNTER — Encounter: Payer: Self-pay | Admitting: Podiatry

## 2019-10-10 MED ORDER — METFORMIN HCL 1000 MG PO TABS: 1000.0000 mg | ORAL_TABLET | Freq: Two times a day (BID) | ORAL | 3 refills | Status: DC

## 2019-10-10 NOTE — Progress Notes (Signed)
Established Patient Office Visit  Subjective:  Patient ID: Larry Moody, male    DOB: 03-11-1971  Age: 48 y.o. MRN: [id number]  CC:  Chief Complaint  Patient presents with  . Follow-up  . Diabetes  . Hyperlipidemia    HPI Larry Moody presents for med check  3 mos since last visit. Discussed HTN and T2DM control.  Had restarted him on metformin 500mg  PO bid. Still taking losartan 50mg  PO qd for HTN with good effect  Reports high stress at work and home due to [employer] and changing leadership  This has led to a poor overall diet in the preceding months Reports overall decent medication compliance, however, is aware he's missed doses.  Denies any changes to vision, chest pain, headaches, shob, doe, dependent edema.   Past Medical History:  Diagnosis Date  . Diabetes mellitus without complication ([place])   . Hypertension     History reviewed. No pertinent surgical history.  Family History  Problem Relation Age of Onset  . Hypertension Mother   . Hypertension Father   . Diabetes Brother     Social History   Socioeconomic History  . Marital status: Married    Spouse name: Not on file  . Number of children: 6  . Years of education: Not on file  . Highest education level: Not on file  Occupational History  . Not on file  Social Needs  . Financial resource strain: Not hard at all  . Food insecurity    Worry: Never true    Inability: Never true  . Transportation needs    Medical: No    Non-medical: No  Tobacco Use  . Smoking status: Current Every Day Smoker    Packs/day: 0.50  . Smokeless tobacco: Never Used  . Tobacco comment: 4-5 cigarrete a day  Substance and Sexual Activity  . Alcohol use: Yes  . Drug use: Not Currently  . Sexual activity: Yes  Lifestyle  . Physical activity    Days per week: Patient refused    Minutes per session: Patient refused  . Stress: To some extent  Relationships  . Social connections    Talks on phone: More than three  times a week    Gets together: Three times a week    Attends religious service: Patient refused    Active member of club or organization: Patient refused    Attends meetings of clubs or organizations: Patient refused    Relationship status: Married  . Intimate partner violence    Fear of current or ex partner: No    Emotionally abused: No    Physically abused: No    Forced sexual activity: No  Other Topics Concern  . Not on file  Social History Narrative  . Not on file    Outpatient Medications Prior to Visit  Medication Sig Dispense Refill  . atorvastatin (LIPITOR) 20 MG tablet TAKE 1 TABLET BY MOUTH EVERY DAY 90 tablet 1  . metFORMIN (GLUCOPHAGE) 500 MG tablet TAKE 1 TABLET BY MOUTH TWICE DAILY 180 tablet 1   No facility-administered medications prior to visit.     Allergies  Allergen Reactions  . Other Nausea And Vomiting    Green peas, watermelon, liver    ROS Review of Systems  Constitutional: Negative.   HENT: Negative.   Eyes: Negative.   Respiratory: Negative.   Cardiovascular: Negative.   Gastrointestinal: Negative.   Endocrine: Negative.   Genitourinary: Negative.   Musculoskeletal: Negative.   Skin: Negative.  Allergic/Immunologic: Negative.   Neurological: Negative.   Hematological: Negative.   Psychiatric/Behavioral: Negative.   All other systems reviewed and are negative.     Objective:    Physical Exam  Constitutional: He is oriented to person, place, and time. He appears well-developed and well-nourished. No distress.  Cardiovascular: Normal rate and regular rhythm.  Pulmonary/Chest: Effort normal and breath sounds normal.  Neurological: He is alert and oriented to person, place, and time. No cranial nerve deficit.  Skin: Skin is warm and dry. No rash noted. He is not diaphoretic. No erythema. No pallor.  Psychiatric: He has a normal mood and affect. His behavior is normal. Judgment and thought content normal.  Nursing note and vitals  reviewed.   BP (!) 162/104   Pulse 98   Temp 98.6 F (37 C)   Resp 14   Ht 6\' 3"  (1.905 m)   Wt (!) 319 lb (144.7 kg)   SpO2 99%   BMI 39.87 kg/m  Wt Readings from Last 3 Encounters:  10/09/19 (!) 319 lb (144.7 kg)  07/14/19 (!) 317 lb (143.8 kg)  05/30/19 (!) 312 lb (141.5 kg)     Health Maintenance Due  Topic Date Due  . PNEUMOCOCCAL POLYSACCHARIDE VACCINE AGE 43-64 HIGH RISK  07/17/1971  . FOOT EXAM  07/17/1979  . OPHTHALMOLOGY EXAM  07/17/1979  . HIV Screening  07/16/1984  . TETANUS/TDAP  07/16/1988  . INFLUENZA VACCINE  06/03/2019  . COLONOSCOPY  07/17/2019    There are no preventive care reminders to display for this patient.  Lab Results  Component Value Date   TSH 1.870 05/30/2019   Lab Results  Component Value Date   WBC 8.1 05/30/2019   HGB 13.4 05/30/2019   HCT 39.7 05/30/2019   MCV 86 05/30/2019   PLT 299 05/30/2019   Lab Results  Component Value Date   NA 139 07/14/2019   K 3.8 07/14/2019   CO2 23 07/14/2019   GLUCOSE 190 (H) 07/14/2019   BUN 11 07/14/2019   CREATININE 0.78 07/14/2019   BILITOT <0.2 07/14/2019   ALKPHOS 193 (H) 07/14/2019   AST 12 07/14/2019   ALT 13 07/14/2019   PROT 6.4 07/14/2019   ALBUMIN 4.0 07/14/2019   CALCIUM 9.1 07/14/2019   Lab Results  Component Value Date   CHOL 180 07/14/2019   Lab Results  Component Value Date   HDL 33 (L) 07/14/2019   Lab Results  Component Value Date   LDLCALC 129 (H) 07/14/2019   Lab Results  Component Value Date   TRIG 97 07/14/2019   Lab Results  Component Value Date   CHOLHDL 5.5 (H) 07/14/2019   Lab Results  Component Value Date   HGBA1C 9.9 (A) 10/09/2019      Assessment & Plan:   Problem List Items Addressed This Visit      Endocrine   Type 2 diabetes mellitus (HCC)   Relevant Medications   atorvastatin (LIPITOR) 20 MG tablet   metFORMIN (GLUCOPHAGE) 500 MG tablet   losartan (COZAAR) 50 MG tablet   metFORMIN (GLUCOPHAGE) 1000 MG tablet   Other Relevant  Orders   POCT A1C (Completed)    Other Visit Diagnoses    Essential hypertension    -  Primary   Relevant Medications   atorvastatin (LIPITOR) 20 MG tablet   losartan (COZAAR) 50 MG tablet   Hyperlipidemia, unspecified hyperlipidemia type       Relevant Medications   atorvastatin (LIPITOR) 20 MG tablet   losartan (COZAAR)  50 MG tablet      Meds ordered this encounter  Medications  . atorvastatin (LIPITOR) 20 MG tablet    Sig: Take 1 tablet (20 mg total) by mouth daily.    Dispense:  90 tablet    Refill:  3  . metFORMIN (GLUCOPHAGE) 500 MG tablet    Sig: Take 1 tablet (500 mg total) by mouth 2 (two) times daily.    Dispense:  180 tablet    Refill:  1  . losartan (COZAAR) 50 MG tablet    Sig: Take 1 tablet (50 mg total) by mouth daily.    Dispense:  90 tablet    Refill:  0    Order Specific Question:   Supervising Provider    Answer:   Collie SiadSTALLINGS, ZOE A [id number]]  . metFORMIN (GLUCOPHAGE) 1000 MG tablet    Sig: Take 1 tablet (1,000 mg total) by mouth 2 (two) times daily with a meal.    Dispense:  180 tablet    Refill:  3    Order Specific Question:   Supervising Provider    Answer:   Doristine BosworthSTALLINGS, ZOE A [id number]]    Follow-up: No follow-ups on file.   PLAN  Increase dose of valsartan to 100mg  PO qd  Return in 2 weeks for nurse visit BP check  Increase metformin to 1000mg  PO bid  Be conscious of diet and exercise choices  Return in 3 mos for med check and a1c  Patient encouraged to call clinic with any questions, comments, or concerns.   Janeece Ageeichard Aaliah Jorgenson, NP

## 2019-10-10 NOTE — Progress Notes (Signed)
  Subjective:  Patient ID: Larry Moody, male    DOB: 05/30/1971,  MRN: [id number]  Chief Complaint  Patient presents with  . Diabetes    pt is here for diabetic foot care, pt is a diabetic type 2, with no other comments or concerns   48 y.o. male returns for the above complaint.  Patient presents with painful elongated thickened mycotic toenails.  Patient would like to have them trimmed out as he is not able to take care of them himself.  He states that they have been painful in nature especially while ambulating.  He denies any other acute complaints.  He is a type II diabetic with last A1c of 9.9.  He ambulates in regular sneakers.  Objective:   Vitals:   10/09/19 1613  BP: (!) 168/109   Podiatric Exam: Vascular: dorsalis pedis and posterior tibial pulses are palpable bilateral. Capillary return is immediate. Temperature gradient is WNL. Skin turgor WNL  Sensorium: Normal Semmes Weinstein monofilament test. Normal tactile sensation bilaterally. Nail Exam: Pt has thick disfigured discolored nails with subungual debris noted bilateral entire nail hallux through fifth toenails Ulcer Exam: There is no evidence of ulcer or pre-ulcerative changes or infection. Orthopedic Exam: Muscle tone and strength are WNL. No limitations in general ROM. No crepitus or effusions noted. HAV  B/L.  Hammer toes 2-5  B/L. Skin: No Porokeratosis. No infection or ulcers  Assessment & Plan:  Patient was evaluated and treated and all questions answered.  Onychomycosis with pain  -Nails palliatively debrided as below. -Educated on self-care  Procedure: Nail Debridement Rationale: pain  Type of Debridement: manual, sharp debridement. Instrumentation: Nail nipper, rotary burr. Number of Nails: 10  Procedures and Treatment: Consent by patient was obtained for treatment procedures. The patient understood the discussion of treatment and procedures well. All questions were answered thoroughly reviewed.  Debridement of mycotic and hypertrophic toenails, 1 through 5 bilateral and clearing of subungual debris. No ulceration, no infection noted.  Return Visit-Office Procedure: Patient instructed to return to the office for a follow up visit 3 months for continued evaluation and treatment.  Boneta Lucks, DPM    No follow-ups on file.

## 2019-10-13 ENCOUNTER — Ambulatory Visit: Payer: BLUE CROSS/BLUE SHIELD | Admitting: Registered Nurse

## 2020-01-01 ENCOUNTER — Ambulatory Visit: Payer: BLUE CROSS/BLUE SHIELD | Admitting: Podiatry

## 2020-01-08 ENCOUNTER — Ambulatory Visit: Payer: BLUE CROSS/BLUE SHIELD | Admitting: Registered Nurse

## 2020-01-13 ENCOUNTER — Ambulatory Visit: Payer: Self-pay | Attending: Internal Medicine

## 2020-01-13 DIAGNOSIS — Z23 Encounter for immunization: Secondary | ICD-10-CM

## 2020-01-13 NOTE — Progress Notes (Signed)
   Covid-19 Vaccination Clinic  Name:  Larry Moody    MRN: [id number] DOB: 1971/01/20  01/13/2020  [name] was observed post Covid-19 immunization for 15 minutes without incident. He was provided with Vaccine Information Sheet and instruction to access the V-Safe system.   [name] was instructed to call 911 with any severe reactions post vaccine: Marland Kitchen Difficulty breathing  . Swelling of face and throat  . A fast heartbeat  . A bad rash all over body  . Dizziness and weakness   Immunizations Administered    Name Date Dose VIS Date Route   Moderna COVID-19 Vaccine 01/13/2020 10:19 AM 0.5 mL 10/03/2019 Intramuscular   Manufacturer: Moderna   Lot: [id number]   NDC: 88891-694-50

## 2020-02-03 ENCOUNTER — Other Ambulatory Visit: Payer: Self-pay | Admitting: Registered Nurse

## 2020-02-03 DIAGNOSIS — I10 Essential (primary) hypertension: Secondary | ICD-10-CM

## 2020-02-14 ENCOUNTER — Ambulatory Visit: Payer: Self-pay | Attending: Internal Medicine

## 2020-02-14 DIAGNOSIS — Z23 Encounter for immunization: Secondary | ICD-10-CM

## 2020-02-14 NOTE — Progress Notes (Signed)
   Covid-19 Vaccination Clinic  Name:  TRACEN MAHLER    MRN: [id number] DOB: Mar 13, 1971  02/14/2020  Mr. Sheerin was observed post Covid-19 immunization for 15 minutes without incident. He was provided with Vaccine Information Sheet and instruction to access the V-Safe system.   Mr. Springborn was instructed to call 911 with any severe reactions post vaccine: Marland Kitchen Difficulty breathing  . Swelling of face and throat  . A fast heartbeat  . A bad rash all over body  . Dizziness and weakness   Immunizations Administered    Name Date Dose VIS Date Route   Moderna COVID-19 Vaccine 02/14/2020  8:47 AM 0.5 mL 10/03/2019 Intramuscular   Manufacturer: Moderna   Lot: [id number]   NDC: 88757-972-82

## 2020-10-30 ENCOUNTER — Other Ambulatory Visit: Payer: Self-pay | Admitting: Registered Nurse

## 2020-10-30 ENCOUNTER — Other Ambulatory Visit: Payer: Self-pay | Admitting: Emergency Medicine

## 2020-10-30 DIAGNOSIS — E119 Type 2 diabetes mellitus without complications: Secondary | ICD-10-CM

## 2020-10-30 DIAGNOSIS — E785 Hyperlipidemia, unspecified: Secondary | ICD-10-CM

## 2020-10-30 MED ORDER — ATORVASTATIN CALCIUM 20 MG PO TABS: 20.0000 mg | ORAL_TABLET | Freq: Every day | ORAL | 0 refills | Status: DC

## 2020-10-30 NOTE — Telephone Encounter (Signed)
Please schedule f/u appt for med refills. 30 day has been sent

## 2020-10-30 NOTE — Telephone Encounter (Signed)
PCP is Jari Sportsman, patient not seen in past year - will route to Rich to decide on refill and amount.

## 2020-10-30 NOTE — Telephone Encounter (Signed)
10/30/2020 - PATIENT REQUESTING A REFILL ON HIS ATORVASTATIN 20 mg. I HAVE SCHEDULED HIS OFFICE VISIT WITH RICH MORROW ON Friday 11/29/2020 AT 11:30 am. FELICIA K. HAS SENT HIM IN A 30 DAY SUPPLY. I DO NOT HAVE TO ROUTE BACK TO THE CLINICAL TEAM AT THIS TIME. MBC

## 2020-11-01 ENCOUNTER — Other Ambulatory Visit: Payer: Self-pay | Admitting: Registered Nurse

## 2020-11-01 DIAGNOSIS — E785 Hyperlipidemia, unspecified: Secondary | ICD-10-CM

## 2020-11-29 ENCOUNTER — Ambulatory Visit: Payer: Self-pay | Admitting: Registered Nurse

## 2020-12-02 ENCOUNTER — Encounter: Payer: Self-pay | Admitting: Registered Nurse

## 2020-12-02 ENCOUNTER — Other Ambulatory Visit: Payer: Self-pay

## 2020-12-02 ENCOUNTER — Ambulatory Visit (INDEPENDENT_AMBULATORY_CARE_PROVIDER_SITE_OTHER): Payer: Self-pay | Admitting: Registered Nurse

## 2020-12-02 VITALS — BP 122/68 | HR 112 | Temp 98.0°F | Resp 18 | Ht 75.0 in | Wt 318.8 lb

## 2020-12-02 DIAGNOSIS — E119 Type 2 diabetes mellitus without complications: Secondary | ICD-10-CM

## 2020-12-02 NOTE — Patient Instructions (Signed)
° ° ° °  If you have lab work done today you will be contacted with your lab results within the next 2 weeks.  If you have not heard from us then please contact us. The fastest way to get your results is to register for My Chart. ° ° °IF you received an x-ray today, you will receive an invoice from [facility]. Please contact [facility] at [phone] with questions or concerns regarding your invoice.  ° °IF you received labwork today, you will receive an invoice from [facility]. Please contact [facility] at [phone] with questions or concerns regarding your invoice.  ° °Our billing staff will not be able to assist you with questions regarding bills from these companies. ° °You will be contacted with the lab results as soon as they are available. The fastest way to get your results is to activate your My Chart account. Instructions are located on the last page of this paperwork. If you have not heard from us regarding the results in 2 weeks, please contact this office. °  ° ° ° °

## 2020-12-02 NOTE — Progress Notes (Signed)
Established Patient Office Visit  Subjective:  Patient ID: Larry Moody, male    DOB: December 26, 1970  Age: 50 y.o. MRN: [id number]  CC:  Chief Complaint  Patient presents with  . Medication Management    Patient would like to discuss medication    HPI [name] presents for med check  Has been off of his meds for some time No longer working at [employer] - now is cooking, much less stress Endorses better overall diet, trying to stay active Had a child 6 days ago, hoping to maintain his health for the sake of his child.  Does note ongoing ED. Has not had full erection in around 2 mo. Has not been on medication for this in the past. No mood d/o or concerns for secondary effect to his knowledge.   No other new complications or concerns that he notes at this time  Past Medical History:  Diagnosis Date  . Diabetes mellitus without complication ([facility])   . Hypertension     No past surgical history on file.  Family History  Problem Relation Age of Onset  . Hypertension Mother   . Hypertension Father   . Diabetes Brother     Social History   Socioeconomic History  . Marital status: Married    Spouse name: Not on file  . Number of children: 6  . Years of education: Not on file  . Highest education level: Not on file  Occupational History  . Not on file  Tobacco Use  . Smoking status: Current Every Day Smoker    Packs/day: 0.50  . Smokeless tobacco: Never Used  . Tobacco comment: 4-5 cigarrete a day  Vaping Use  . Vaping Use: Never used  Substance and Sexual Activity  . Alcohol use: Yes  . Drug use: Not Currently  . Sexual activity: Yes  Other Topics Concern  . Not on file  Social History Narrative  . Not on file   Social Determinants of Health   Financial Resource Strain: Not on file  Food Insecurity: Not on file  Transportation Needs: Not on file  Physical Activity: Not on file  Stress: Not on file  Social Connections: Not on file  Intimate Partner  Violence: Not on file    Outpatient Medications Prior to Visit  Medication Sig Dispense Refill  . atorvastatin (LIPITOR) 20 MG tablet TAKE 1 TABLET(20 MG) BY MOUTH DAILY (Patient not taking: Reported on 12/02/2020) 90 tablet 0  . losartan (COZAAR) 50 MG tablet TAKE 1 TABLET(50 MG) BY MOUTH DAILY (Patient not taking: Reported on 12/02/2020) 90 tablet 0  . metFORMIN (GLUCOPHAGE) 1000 MG tablet TAKE 1 TABLET(1000 MG) BY MOUTH TWICE DAILY WITH A MEAL (Patient not taking: Reported on 12/02/2020) 60 tablet 0  . metFORMIN (GLUCOPHAGE) 500 MG tablet Take 1 tablet (500 mg total) by mouth 2 (two) times daily. (Patient not taking: Reported on 12/02/2020) 180 tablet 1   No facility-administered medications prior to visit.    Allergies  Allergen Reactions  . Other Nausea And Vomiting    Green peas, watermelon, liver    ROS Review of Systems  Constitutional: Negative.   HENT: Negative.   Eyes: Negative.   Respiratory: Negative.   Cardiovascular: Negative.   Gastrointestinal: Negative.   Genitourinary: Negative.   Musculoskeletal: Negative.   Skin: Negative.   Neurological: Negative.   Psychiatric/Behavioral: Negative.   All other systems reviewed and are negative.     Objective:    Physical Exam Constitutional:  General: He is not in acute distress.    Appearance: Normal appearance. He is normal weight. He is not ill-appearing, toxic-appearing or diaphoretic.  Cardiovascular:     Rate and Rhythm: Normal rate and regular rhythm.     Heart sounds: Normal heart sounds. No murmur heard. No friction rub. No gallop.   Pulmonary:     Effort: Pulmonary effort is normal. No respiratory distress.     Breath sounds: Normal breath sounds. No stridor. No wheezing, rhonchi or rales.  Chest:     Chest wall: No tenderness.  Neurological:     General: No focal deficit present.     Mental Status: He is alert and oriented to person, place, and time. Mental status is at baseline.  Psychiatric:         Mood and Affect: Mood normal.        Behavior: Behavior normal.        Thought Content: Thought content normal.        Judgment: Judgment normal.     BP 122/68   Pulse (!) 112   Temp 98 F (36.7 C) (Temporal)   Resp 18   Ht 6\' 3"  (1.905 m)   Wt (!) 318 lb 12.8 oz (144.6 kg)   SpO2 100%   BMI 39.85 kg/m  Wt Readings from Last 3 Encounters:  12/02/20 (!) 318 lb 12.8 oz (144.6 kg)  10/09/19 (!) 319 lb (144.7 kg)  07/14/19 (!) 317 lb (143.8 kg)     Health Maintenance Due  Topic Date Due  . HEMOGLOBIN A1C  04/08/2020    There are no preventive care reminders to display for this patient.  Lab Results  Component Value Date   TSH 1.870 05/30/2019   Lab Results  Component Value Date   WBC 8.1 05/30/2019   HGB 13.4 05/30/2019   HCT 39.7 05/30/2019   MCV 86 05/30/2019   PLT 299 05/30/2019   Lab Results  Component Value Date   NA 139 07/14/2019   K 3.8 07/14/2019   CO2 23 07/14/2019   GLUCOSE 190 (H) 07/14/2019   BUN 11 07/14/2019   CREATININE 0.78 07/14/2019   BILITOT <0.2 07/14/2019   ALKPHOS 193 (H) 07/14/2019   AST 12 07/14/2019   ALT 13 07/14/2019   PROT 6.4 07/14/2019   ALBUMIN 4.0 07/14/2019   CALCIUM 9.1 07/14/2019   Lab Results  Component Value Date   CHOL 180 07/14/2019   Lab Results  Component Value Date   HDL 33 (L) 07/14/2019   Lab Results  Component Value Date   LDLCALC 129 (H) 07/14/2019   Lab Results  Component Value Date   TRIG 97 07/14/2019   Lab Results  Component Value Date   CHOLHDL 5.5 (H) 07/14/2019   Lab Results  Component Value Date   HGBA1C 9.9 (A) 10/09/2019      Assessment & Plan:   Problem List Items Addressed This Visit      Endocrine   Type 2 diabetes mellitus (HCC) - Primary   Relevant Orders   Hemoglobin A1c   Comprehensive metabolic panel   Lipid panel      No orders of the defined types were placed in this encounter.   Follow-up: No follow-ups on file.   PLAN  Labs collected. Will  follow up with the patient as warranted.  Given his stellar bp today will not need to resume antihypertensives at this time  Based on labs will determine best medication regimen  Discussed that  ED is likely secondary to stress and t2dm, but can consider sildenafil if labs acceptable  Follow up based on lab results.  Patient encouraged to call clinic with any questions, comments, or concerns.  Janeece Agee, NP

## 2020-12-03 LAB — COMPREHENSIVE METABOLIC PANEL
ALT: 27 IU/L (ref 0–44)
AST: 16 IU/L (ref 0–40)
Albumin/Globulin Ratio: 1.5 (ref 1.2–2.2)
Albumin: 4.1 g/dL (ref 3.8–4.9)
Alkaline Phosphatase: 255 IU/L — ABNORMAL HIGH (ref 44–121)
BUN/Creatinine Ratio: 13 (ref 9–20)
BUN: 12 mg/dL (ref 6–24)
Bilirubin Total: 0.2 mg/dL (ref 0.0–1.2)
CO2: 27 mmol/L (ref 20–29)
Calcium: 9.7 mg/dL (ref 8.7–10.2)
Chloride: 98 mmol/L (ref 96–106)
Creatinine, Ser: 0.93 mg/dL (ref 0.76–1.27)
GFR calc Af Amer: 109 mL/min/{1.73_m2} (ref >59)
GFR calc non Af Amer: 95 mL/min/{1.73_m2} (ref >59)
Globulin, Total: 2.8 g/dL (ref 1.5–4.5)
Glucose: 488 mg/dL — ABNORMAL HIGH (ref 65–99)
Potassium: 4.4 mmol/L (ref 3.5–5.2)
Sodium: 137 mmol/L (ref 134–144)
Total Protein: 6.9 g/dL (ref 6.0–8.5)

## 2020-12-03 LAB — LIPID PANEL
Chol/HDL Ratio: 10.7 ratio — ABNORMAL HIGH (ref 0.0–5.0)
Cholesterol, Total: 310 mg/dL — ABNORMAL HIGH (ref 100–199)
HDL: 29 mg/dL — ABNORMAL LOW (ref >39)
LDL Chol Calc (NIH): 217 mg/dL — ABNORMAL HIGH (ref 0–99)
Triglycerides: 309 mg/dL — ABNORMAL HIGH (ref 0–149)
VLDL Cholesterol Cal: 64 mg/dL — ABNORMAL HIGH (ref 5–40)

## 2020-12-03 LAB — HEMOGLOBIN A1C
Est. average glucose Bld gHb Est-mCnc: 298 mg/dL
Hgb A1c MFr Bld: 12 % — ABNORMAL HIGH (ref 4.8–5.6)

## 2020-12-12 ENCOUNTER — Other Ambulatory Visit: Payer: Self-pay | Admitting: Registered Nurse

## 2020-12-12 ENCOUNTER — Encounter: Payer: Self-pay | Admitting: Registered Nurse

## 2020-12-12 DIAGNOSIS — E119 Type 2 diabetes mellitus without complications: Secondary | ICD-10-CM

## 2020-12-12 DIAGNOSIS — E785 Hyperlipidemia, unspecified: Secondary | ICD-10-CM

## 2020-12-13 NOTE — Telephone Encounter (Signed)
Please advise 

## 2020-12-16 MED ORDER — ATORVASTATIN CALCIUM 20 MG PO TABS: 20.0000 mg | ORAL_TABLET | Freq: Every day | ORAL | 0 refills | Status: DC

## 2020-12-16 MED ORDER — METFORMIN HCL 1000 MG PO TABS: 1000.0000 mg | ORAL_TABLET | Freq: Two times a day (BID) | ORAL | 0 refills | Status: DC

## 2020-12-17 ENCOUNTER — Other Ambulatory Visit: Payer: Self-pay | Admitting: Registered Nurse

## 2020-12-17 DIAGNOSIS — B359 Dermatophytosis, unspecified: Secondary | ICD-10-CM

## 2020-12-17 MED ORDER — NYSTATIN 100000 UNIT/GM EX POWD: 1.0000 "application " | Freq: Three times a day (TID) | CUTANEOUS | 0 refills | Status: DC

## 2021-08-30 ENCOUNTER — Encounter (HOSPITAL_COMMUNITY): Payer: Self-pay

## 2021-08-30 ENCOUNTER — Other Ambulatory Visit: Payer: Self-pay

## 2021-08-30 ENCOUNTER — Observation Stay (HOSPITAL_COMMUNITY)
Admission: EM | Admit: 2021-08-30 | Discharge: 2021-09-01 | Disposition: A | Discharge status: Home / Self Care | Payer: BC Managed Care – PPO | Attending: Internal Medicine | Admitting: Internal Medicine

## 2021-08-30 DIAGNOSIS — Z20822 Contact with and (suspected) exposure to covid-19: Secondary | ICD-10-CM | POA: Insufficient documentation

## 2021-08-30 DIAGNOSIS — R109 Unspecified abdominal pain: Secondary | ICD-10-CM

## 2021-08-30 DIAGNOSIS — E785 Hyperlipidemia, unspecified: Secondary | ICD-10-CM

## 2021-08-30 DIAGNOSIS — N179 Acute kidney failure, unspecified: Secondary | ICD-10-CM | POA: Diagnosis not present

## 2021-08-30 DIAGNOSIS — I10 Essential (primary) hypertension: Secondary | ICD-10-CM | POA: Diagnosis present

## 2021-08-30 DIAGNOSIS — Z79899 Other long term (current) drug therapy: Secondary | ICD-10-CM | POA: Diagnosis not present

## 2021-08-30 DIAGNOSIS — R112 Nausea with vomiting, unspecified: Secondary | ICD-10-CM | POA: Diagnosis not present

## 2021-08-30 DIAGNOSIS — E669 Obesity, unspecified: Secondary | ICD-10-CM | POA: Diagnosis present

## 2021-08-30 DIAGNOSIS — Z7984 Long term (current) use of oral hypoglycemic drugs: Secondary | ICD-10-CM | POA: Insufficient documentation

## 2021-08-30 DIAGNOSIS — F17210 Nicotine dependence, cigarettes, uncomplicated: Secondary | ICD-10-CM | POA: Insufficient documentation

## 2021-08-30 DIAGNOSIS — K567 Ileus, unspecified: Secondary | ICD-10-CM | POA: Diagnosis present

## 2021-08-30 DIAGNOSIS — E119 Type 2 diabetes mellitus without complications: Secondary | ICD-10-CM | POA: Diagnosis not present

## 2021-08-30 LAB — CBC WITH DIFFERENTIAL/PLATELET
Abs Immature Granulocytes: 0.02 10*3/uL (ref 0.00–0.07)
Basophils Absolute: 0 10*3/uL (ref 0.0–0.1)
Basophils Relative: 0 %
Eosinophils Absolute: 0.1 10*3/uL (ref 0.0–0.5)
Eosinophils Relative: 1 %
HCT: 46.9 % (ref 39.0–52.0)
Hemoglobin: 15.6 g/dL (ref 13.0–17.0)
Immature Granulocytes: 0 %
Lymphocytes Relative: 24 %
Lymphs Abs: 2.4 10*3/uL (ref 0.7–4.0)
MCH: 29.3 pg (ref 26.0–34.0)
MCHC: 33.3 g/dL (ref 30.0–36.0)
MCV: 88 fL (ref 80.0–100.0)
Monocytes Absolute: 0.5 10*3/uL (ref 0.1–1.0)
Monocytes Relative: 5 %
Neutro Abs: 7.1 10*3/uL (ref 1.7–7.7)
Neutrophils Relative %: 70 %
Platelets: 338 10*3/uL (ref 150–400)
RBC: 5.33 MIL/uL (ref 4.22–5.81)
RDW: 12.5 % (ref 11.5–15.5)
WBC: 10.2 10*3/uL (ref 4.0–10.5)
nRBC: 0 % (ref 0.0–0.2)

## 2021-08-30 LAB — HEPATIC FUNCTION PANEL
ALT: 21 U/L (ref 0–44)
AST: 15 U/L (ref 15–41)
Albumin: 4.3 g/dL (ref 3.5–5.0)
Alkaline Phosphatase: 179 U/L — ABNORMAL HIGH (ref 38–126)
Bilirubin, Direct: <0.1 mg/dL (ref 0.0–0.2)
Total Bilirubin: 0.4 mg/dL (ref 0.3–1.2)
Total Protein: 8.2 g/dL — ABNORMAL HIGH (ref 6.5–8.1)

## 2021-08-30 LAB — BASIC METABOLIC PANEL
Anion gap: 10 (ref 5–15)
BUN: 20 mg/dL (ref 6–20)
CO2: 20 mmol/L — ABNORMAL LOW (ref 22–32)
Calcium: 9.6 mg/dL (ref 8.9–10.3)
Chloride: 103 mmol/L (ref 98–111)
Creatinine, Ser: 1.94 mg/dL — ABNORMAL HIGH (ref 0.61–1.24)
GFR, Estimated: 41 mL/min — ABNORMAL LOW (ref >60)
Glucose, Bld: 270 mg/dL — ABNORMAL HIGH (ref 70–99)
Potassium: 4 mmol/L (ref 3.5–5.1)
Sodium: 133 mmol/L — ABNORMAL LOW (ref 135–145)

## 2021-08-30 LAB — LIPASE, BLOOD: Lipase: 21 U/L (ref 11–51)

## 2021-08-30 MED ORDER — ONDANSETRON HCL 4 MG/2ML IJ SOLN
4.0000 mg | Freq: Once | INTRAMUSCULAR | Status: AC
  Administered 2021-08-30: 4 mg via INTRAVENOUS
  Filled 2021-08-30: qty 2

## 2021-08-30 MED ORDER — SODIUM CHLORIDE 0.9 % IV BOLUS
2000.0000 mL | Freq: Once | INTRAVENOUS | Status: AC
  Administered 2021-08-30: 2000 mL via INTRAVENOUS

## 2021-08-30 MED ORDER — LOPERAMIDE HCL 2 MG PO CAPS
4.0000 mg | ORAL_CAPSULE | Freq: Once | ORAL | Status: AC
  Administered 2021-08-30: 4 mg via ORAL
  Filled 2021-08-30: qty 2

## 2021-08-30 NOTE — ED Provider Notes (Signed)
[facility] EMERGENCY DEPARTMENT Provider Note   CSN: [id number] Arrival date & time: 08/30/21  1957     History Chief Complaint  Patient presents with   Emesis    Larry Moody is a 50 y.o. male.  Patient states that he has been vomiting and having diarrhea all day long today.  He is vomited at least 10 times and had no blood in his diarrhea or vomit  The history is provided by the patient and medical records. No language interpreter was used.  Emesis Severity:  Moderate Duration:  1 day Timing:  Constant Quality:  Bilious material Able to tolerate:  Liquids Progression:  Improving Chronicity:  New Recent urination:  Increased Relieved by:  Nothing Worsened by:  Nothing Ineffective treatments:  None tried Associated symptoms: diarrhea   Associated symptoms: no abdominal pain, no cough and no headaches       Past Medical History:  Diagnosis Date   Diabetes mellitus without complication (HCC)    Hypertension     Patient Active Problem List   Diagnosis Date Noted   AKI (acute kidney injury) (HCC) 08/30/2021   Type 2 diabetes mellitus (HCC) 05/30/2019    History reviewed. No pertinent surgical history.     Family History  Problem Relation Age of Onset   Hypertension Mother    Hypertension Father    Diabetes Brother     Social History   Tobacco Use   Smoking status: Every Day    Packs/day: 0.50    Types: Cigarettes   Smokeless tobacco: Never   Tobacco comments:    4-5 cigarrete a day  Vaping Use   Vaping Use: Every day  Substance Use Topics   Alcohol use: Yes   Drug use: Not Currently    Home Medications Prior to Admission medications   Medication Sig Start Date End Date Taking? Authorizing Provider  atorvastatin (LIPITOR) 20 MG tablet Take 1 tablet (20 mg total) by mouth daily. 12/16/20   Janeece Agee, NP  losartan (COZAAR) 50 MG tablet TAKE 1 TABLET(50 MG) BY MOUTH DAILY Patient not taking: Reported on 12/02/2020 02/05/20   Janeece Agee, NP  metFORMIN (GLUCOPHAGE) 1000 MG tablet Take 1 tablet (1,000 mg total) by mouth 2 (two) times daily with a meal. 12/16/20   Janeece Agee, NP  metFORMIN (GLUCOPHAGE) 500 MG tablet Take 1 tablet (500 mg total) by mouth 2 (two) times daily. Patient not taking: Reported on 12/02/2020 10/09/19   Janeece Agee, NP  nystatin (MYCOSTATIN/NYSTOP) powder Apply 1 application topically 3 (three) times daily. 12/17/20   Janeece Agee, NP    Allergies    Other  Review of Systems   Review of Systems  Constitutional:  Negative for appetite change and fatigue.  HENT:  Negative for congestion, ear discharge and sinus pressure.   Eyes:  Negative for discharge.  Respiratory:  Negative for cough.   Cardiovascular:  Negative for chest pain.  Gastrointestinal:  Positive for diarrhea and vomiting. Negative for abdominal pain.  Genitourinary:  Negative for frequency and hematuria.  Musculoskeletal:  Negative for back pain.  Skin:  Negative for rash.  Neurological:  Negative for seizures and headaches.  Psychiatric/Behavioral:  Negative for hallucinations.    Physical Exam Updated Vital Signs BP 128/90   Pulse 79   Temp 98.5 F (36.9 C) (Oral)   Resp 18   Ht 6' 3.5" (1.918 m)   Wt 136.1 kg   SpO2 97%   BMI 37.00 kg/m   Physical Exam  Vitals and nursing note reviewed.  Constitutional:      Appearance: He is well-developed.  HENT:     Head: Normocephalic.     Comments: Dry mucous membranes    Nose: Nose normal.  Eyes:     General: No scleral icterus.    Conjunctiva/sclera: Conjunctivae normal.  Neck:     Thyroid: No thyromegaly.  Cardiovascular:     Rate and Rhythm: Normal rate and regular rhythm.     Heart sounds: No murmur heard.   No friction rub. No gallop.  Pulmonary:     Breath sounds: No stridor. No wheezing or rales.  Chest:     Chest wall: No tenderness.  Abdominal:     General: There is no distension.     Tenderness: There is no abdominal tenderness. There is no  rebound.  Musculoskeletal:        General: Normal range of motion.     Cervical back: Neck supple.  Lymphadenopathy:     Cervical: No cervical adenopathy.  Skin:    Findings: No erythema or rash.  Neurological:     Mental Status: He is alert and oriented to person, place, and time.     Motor: No abnormal muscle tone.     Coordination: Coordination normal.  Psychiatric:        Behavior: Behavior normal.    ED Results / Procedures / Treatments   Labs (all labs ordered are listed, but only abnormal results are displayed) Labs Reviewed  BASIC METABOLIC PANEL - Abnormal; Notable for the following components:      Result Value   Sodium 133 (*)    CO2 20 (*)    Glucose, Bld 270 (*)    Creatinine, Ser 1.94 (*)    GFR, Estimated 41 (*)    All other components within normal limits  HEPATIC FUNCTION PANEL - Abnormal; Notable for the following components:   Total Protein 8.2 (*)    Alkaline Phosphatase 179 (*)    All other components within normal limits  CBC WITH DIFFERENTIAL/PLATELET  LIPASE, BLOOD    EKG None  Radiology No results found.  Procedures Procedures   Medications Ordered in ED Medications  sodium chloride 0.9 % bolus 2,000 mL (2,000 mLs Intravenous New Bag/Given 08/30/21 2129)  loperamide (IMODIUM) capsule 4 mg (4 mg Oral Given 08/30/21 2128)  ondansetron (ZOFRAN) injection 4 mg (4 mg Intravenous Given 08/30/21 2128)    ED Course  I have reviewed the triage vital signs and the nursing notes.  Pertinent labs & imaging results that were available during my care of the patient were reviewed by me and considered in my medical decision making (see chart for details).    MDM Rules/Calculators/A&P                           Patient with an AKI.  He will be admitted to medicine and hydrated Final Clinical Impression(s) / ED Diagnoses Final diagnoses:  AKI (acute kidney injury) [facility])    Rx / DC Orders ED Discharge Orders     None        Bethann Berkshire, MD 08/30/21 2304

## 2021-08-30 NOTE — ED Triage Notes (Signed)
Pt arrived via POV c/o N/V/D since 0400 today. Pt reports 8 episodes of emesis and 4 episodes of diarrhea today. Pt unable to keep tolerate anything PO at this time. Pt reports decreased appetite.

## 2021-08-31 ENCOUNTER — Observation Stay (HOSPITAL_COMMUNITY): Payer: BC Managed Care – PPO

## 2021-08-31 DIAGNOSIS — E1165 Type 2 diabetes mellitus with hyperglycemia: Secondary | ICD-10-CM

## 2021-08-31 DIAGNOSIS — K567 Ileus, unspecified: Secondary | ICD-10-CM | POA: Diagnosis not present

## 2021-08-31 DIAGNOSIS — R109 Unspecified abdominal pain: Secondary | ICD-10-CM

## 2021-08-31 DIAGNOSIS — I10 Essential (primary) hypertension: Secondary | ICD-10-CM | POA: Diagnosis not present

## 2021-08-31 DIAGNOSIS — E669 Obesity, unspecified: Secondary | ICD-10-CM | POA: Diagnosis present

## 2021-08-31 DIAGNOSIS — N179 Acute kidney failure, unspecified: Secondary | ICD-10-CM | POA: Diagnosis not present

## 2021-08-31 DIAGNOSIS — E6609 Other obesity due to excess calories: Secondary | ICD-10-CM

## 2021-08-31 DIAGNOSIS — Z6838 Body mass index (BMI) 38.0-38.9, adult: Secondary | ICD-10-CM

## 2021-08-31 LAB — CBC
HCT: 39.9 % (ref 39.0–52.0)
Hemoglobin: 13.2 g/dL (ref 13.0–17.0)
MCH: 29.5 pg (ref 26.0–34.0)
MCHC: 33.1 g/dL (ref 30.0–36.0)
MCV: 89.3 fL (ref 80.0–100.0)
Platelets: 266 10*3/uL (ref 150–400)
RBC: 4.47 MIL/uL (ref 4.22–5.81)
RDW: 12.5 % (ref 11.5–15.5)
WBC: 7.8 10*3/uL (ref 4.0–10.5)
nRBC: 0 % (ref 0.0–0.2)

## 2021-08-31 LAB — COMPREHENSIVE METABOLIC PANEL
ALT: 14 U/L (ref 0–44)
AST: 10 U/L — ABNORMAL LOW (ref 15–41)
Albumin: 3.1 g/dL — ABNORMAL LOW (ref 3.5–5.0)
Alkaline Phosphatase: 129 U/L — ABNORMAL HIGH (ref 38–126)
Anion gap: 6 (ref 5–15)
BUN: 20 mg/dL (ref 6–20)
CO2: 22 mmol/L (ref 22–32)
Calcium: 8.4 mg/dL — ABNORMAL LOW (ref 8.9–10.3)
Chloride: 108 mmol/L (ref 98–111)
Creatinine, Ser: 1.25 mg/dL — ABNORMAL HIGH (ref 0.61–1.24)
GFR, Estimated: >60 mL/min (ref >60)
Glucose, Bld: 185 mg/dL — ABNORMAL HIGH (ref 70–99)
Potassium: 3.6 mmol/L (ref 3.5–5.1)
Sodium: 136 mmol/L (ref 135–145)
Total Bilirubin: 0.3 mg/dL (ref 0.3–1.2)
Total Protein: 6.2 g/dL — ABNORMAL LOW (ref 6.5–8.1)

## 2021-08-31 LAB — GLUCOSE, CAPILLARY
Glucose-Capillary: 151 mg/dL — ABNORMAL HIGH (ref 70–99)
Glucose-Capillary: 186 mg/dL — ABNORMAL HIGH (ref 70–99)
Glucose-Capillary: 204 mg/dL — ABNORMAL HIGH (ref 70–99)
Glucose-Capillary: 155 mg/dL — ABNORMAL HIGH (ref 70–99)

## 2021-08-31 LAB — RESP PANEL BY RT-PCR (FLU A&B, COVID) ARPGX2
Influenza A by PCR: NEGATIVE
Influenza B by PCR: NEGATIVE
SARS Coronavirus 2 by RT PCR: NEGATIVE

## 2021-08-31 LAB — HEMOGLOBIN A1C
Hgb A1c MFr Bld: 11 % — ABNORMAL HIGH (ref 4.8–5.6)
Mean Plasma Glucose: 269 mg/dL

## 2021-08-31 LAB — MAGNESIUM: Magnesium: 1.6 mg/dL — ABNORMAL LOW (ref 1.7–2.4)

## 2021-08-31 LAB — HIV ANTIBODY (ROUTINE TESTING W REFLEX): HIV Screen 4th Generation wRfx: NONREACTIVE

## 2021-08-31 MED ORDER — ONDANSETRON HCL 4 MG/2ML IJ SOLN: 4.0000 mg | Freq: Four times a day (QID) | INTRAMUSCULAR | Status: DC | PRN

## 2021-08-31 MED ORDER — ONDANSETRON HCL 4 MG PO TABS: 4.0000 mg | ORAL_TABLET | Freq: Four times a day (QID) | ORAL | Status: DC | PRN

## 2021-08-31 MED ORDER — ATORVASTATIN CALCIUM 20 MG PO TABS
20.0000 mg | ORAL_TABLET | Freq: Every day | ORAL | Status: DC
  Administered 2021-08-31 – 2021-09-01 (×2): 20 mg via ORAL
  Filled 2021-08-31 (×2): qty 1

## 2021-08-31 MED ORDER — ACETAMINOPHEN 650 MG RE SUPP: 650.0000 mg | Freq: Four times a day (QID) | RECTAL | Status: DC | PRN

## 2021-08-31 MED ORDER — HEPARIN SODIUM (PORCINE) 5000 UNIT/ML IJ SOLN
5000.0000 [IU] | Freq: Three times a day (TID) | INTRAMUSCULAR | Status: DC
  Administered 2021-08-31 – 2021-09-01 (×4): 5000 [IU] via SUBCUTANEOUS
  Filled 2021-08-31 (×4): qty 1

## 2021-08-31 MED ORDER — INSULIN ASPART 100 UNIT/ML IJ SOLN
0.0000 [IU] | Freq: Three times a day (TID) | INTRAMUSCULAR | Status: DC
  Administered 2021-08-31: 5 [IU] via SUBCUTANEOUS
  Administered 2021-09-01: 3 [IU] via SUBCUTANEOUS

## 2021-08-31 MED ORDER — SODIUM CHLORIDE 0.9 % IV SOLN: INTRAVENOUS | Status: DC

## 2021-08-31 MED ORDER — OXYCODONE HCL 5 MG PO TABS: 5.0000 mg | ORAL_TABLET | ORAL | Status: DC | PRN

## 2021-08-31 MED ORDER — INSULIN ASPART 100 UNIT/ML IJ SOLN
0.0000 [IU] | Freq: Every day | INTRAMUSCULAR | Status: DC
Start: 2021-08-31 — End: 2021-09-01

## 2021-08-31 MED ORDER — MORPHINE SULFATE (PF) 4 MG/ML IV SOLN: 4.0000 mg | INTRAVENOUS | Status: DC | PRN

## 2021-08-31 MED ORDER — MAGNESIUM SULFATE 2 GM/50ML IV SOLN
2.0000 g | Freq: Once | INTRAVENOUS | Status: AC
  Administered 2021-08-31: 2 g via INTRAVENOUS
  Filled 2021-08-31: qty 50

## 2021-08-31 MED ORDER — ACETAMINOPHEN 325 MG PO TABS: 650.0000 mg | ORAL_TABLET | Freq: Four times a day (QID) | ORAL | Status: DC | PRN

## 2021-08-31 MED ORDER — MAGNESIUM SULFATE 50 % IJ SOLN: 2.0000 g | Freq: Once | INTRAMUSCULAR | Status: DC

## 2021-08-31 NOTE — Progress Notes (Signed)
Pt refused to take insulin. States, " I take metformin at home and I'm not comfortable taking insulin." MD notified.

## 2021-08-31 NOTE — Progress Notes (Signed)
Patient seen and examined.  Admitted after midnight secondary to nausea, vomiting, diarrhea and abdominal pain.  Currently hemodynamically stable and reporting improvement in his symptoms.  Still with intermittent abdominal discomfort but denies any further episode of nausea vomiting.  Patient is afebrile.  No overt bleeding reported.  Work-up in the demonstrating acute kidney injury in the setting of prerenal azotemia and GI losses.  Please refer to H&P written by Dr. Carren Rang on 08/31/21 for further info/details on admission .   Plan: -Continue IV fluids, antiemetics and analgesics. -Continue advancing diet slowly -Continue to follow electrolytes and renal function -Creatinine has improved, but is still not back to baseline. -Hopefully discharge on 09/01/21 -Education about outpatient diabetic medications and the need for insulin inside the hospital sustained; patient in agreement to follow sliding scale insulin.  Vassie Loll MD [phone]

## 2021-08-31 NOTE — H&P (Signed)
TRH H&P    Patient Demographics:    Larry Moody, is a 50 y.o. male  MRN: [id number]  DOB - 07/10/1971  Admit Date - 08/30/2021  Referring MD/NP/PA: Estell Harpin  Outpatient Primary MD for the patient is Janeece Agee, NP  Patient coming from: Home  Chief complaint- n/v/d   HPI:    Larry Moody  is a 50 y.o. male, with history of diabetes mellitus type 2, and hypertension presents the ED with a chief complaint of nausea vomiting, diarrhea.  Patient reports it started at 4 AM on the same day as presentation.  He has had at least 8-10 episodes of each per his report.  The emesis now looks like yellow liquid, without blood or coffee grounds.  There is also no blood in the stool.  Patient reports that at first he had a knot like feeling in the right lower quadrant, that has since resolved.  He is not sure what made it go away.  His last normal meal was the evening of the 28th.  His last normal bowel movement was also the 28th.  It is normal for him to have bowel movement every other day or every third day.  No one else is sick in the home.  He has not recently been on any antibiotics.  He has not eaten any questionable food.  He is otherwise in his normal state of health.  Patient smokes cigars twice per month, and he uses a vape.  He declines nicotine patch at this time.  He drinks approximately once per month.  He does not use illicit drugs.  Patient is full code.  In the ED temp 98.5, heart rate 79-100, respiratory rate 18-19, blood pressure 128/90 satting 97% No leukocytosis with a white blood cell count of 10.2, hemoglobin 15.6 Chemistry panel reveals an AKI and hyperglycemia Patient was given Imodium, Zofran, 2 L bolus in the ED Admission was requested for AKI   Review of systems:    In addition to the HPI above,  No Fever-chills, No Headache, No changes with Vision or hearing, No problems swallowing food  or Liquids, No Chest pain, Cough or Shortness of Breath, No Blood in stool or Urine, No dysuria, No new skin rashes or bruises, No new joints pains-aches,  No new weakness, tingling, numbness in any extremity, No recent weight gain or loss, No polyuria, polydypsia or polyphagia, No significant Mental Stressors.  All other systems reviewed and are negative.    Past History of the following :    Past Medical History:  Diagnosis Date   Diabetes mellitus without complication (HCC)    Hypertension       History reviewed. No pertinent surgical history.    Social History:      Social History   Tobacco Use   Smoking status: Every Day    Packs/day: 0.50    Types: Cigarettes   Smokeless tobacco: Never   Tobacco comments:    4-5 cigarrete a day  Substance Use Topics   Alcohol use: Yes  Family History :     Family History  Problem Relation Age of Onset   Hypertension Mother    Hypertension Father    Diabetes Brother       Home Medications:   Prior to Admission medications   Medication Sig Start Date End Date Taking? Authorizing Provider  metFORMIN (GLUCOPHAGE) 1000 MG tablet Take 1 tablet (1,000 mg total) by mouth 2 (two) times daily with a meal. 12/16/20  Yes Janeece Agee, NP  metFORMIN (GLUCOPHAGE) 500 MG tablet Take 1 tablet (500 mg total) by mouth 2 (two) times daily. 10/09/19  Yes Janeece Agee, NP  atorvastatin (LIPITOR) 20 MG tablet Take 1 tablet (20 mg total) by mouth daily. 12/16/20   Janeece Agee, NP  losartan (COZAAR) 50 MG tablet TAKE 1 TABLET(50 MG) BY MOUTH DAILY Patient not taking: No sig reported 02/05/20   Janeece Agee, NP  nystatin (MYCOSTATIN/NYSTOP) powder Apply 1 application topically 3 (three) times daily. 12/17/20   Janeece Agee, NP     Allergies:     Allergies  Allergen Reactions   Other Nausea And Vomiting    Green peas, watermelon, liver     Physical Exam:   Vitals  Blood pressure 125/80, pulse 90, temperature  98 F (36.7 C), temperature source Oral, resp. rate 17, height 6\' 3"  (1.905 m), weight (!) 138.5 kg, SpO2 98 %.  1.  General: Patient lying supine in bed,  no acute distress   2. Psychiatric: Alert and oriented x 3, mood and behavior normal for situation, pleasant and cooperative with exam   3. Neurologic: Speech and language are normal, face is symmetric, moves all 4 extremities voluntarily, at baseline without acute deficits on limited exam   4. HEENMT:  Head is atraumatic, normocephalic, pupils reactive to light, neck is supple, trachea is midline, mucous membranes are moist   5. Respiratory : Lungs are clear to auscultation bilaterally without wheezing, rhonchi, rales, no cyanosis, no increase in work of breathing or accessory muscle use   6. Cardiovascular : Heart rate normal, rhythm is regular, no murmurs, rubs or gallops, no peripheral edema, peripheral pulses palpated   7. Gastrointestinal:  Abdomen is soft, nondistended, nontender to palpation bowel sounds active, no masses or organomegaly palpated   8. Skin:  Skin is warm, dry and intact without rashes, acute lesions, or ulcers on limited exam   9.Musculoskeletal:  No acute deformities or trauma, no asymmetry in tone, no peripheral edema, peripheral pulses palpated, no tenderness to palpation in the extremities     Data Review:    CBC Recent Labs  Lab 08/30/21 2038  WBC 10.2  HGB 15.6  HCT 46.9  PLT 338  MCV 88.0  MCH 29.3  MCHC 33.3  RDW 12.5  LYMPHSABS 2.4  MONOABS 0.5  EOSABS 0.1  BASOSABS 0.0   ------------------------------------------------------------------------------------------------------------------  Results for orders placed or performed during the hospital encounter of 08/30/21 (from the past 48 hour(s))  Basic metabolic panel     Status: Abnormal   Collection Time: 08/30/21  8:38 PM  Result Value Ref Range   Sodium 133 (L) 135 - 145 mmol/L   Potassium 4.0 3.5 - 5.1 mmol/L   Chloride  103 98 - 111 mmol/L   CO2 20 (L) 22 - 32 mmol/L   Glucose, Bld 270 (H) 70 - 99 mg/dL    Comment: Glucose reference range applies only to samples taken after fasting for at least 8 hours.   BUN 20 6 - 20 mg/dL  Creatinine, Ser 1.94 (H) 0.61 - 1.24 mg/dL   Calcium 9.6 8.9 - 63.8 mg/dL   GFR, Estimated 41 (L) >60 mL/min    Comment: (NOTE) Calculated using the CKD-EPI Creatinine Equation (2021)    Anion gap 10 5 - 15    Comment: Performed at [facility], [address] [place], [place] [address]  CBC with Differential     Status: None   Collection Time: 08/30/21  8:38 PM  Result Value Ref Range   WBC 10.2 4.0 - 10.5 K/uL   RBC 5.33 4.22 - 5.81 MIL/uL   Hemoglobin 15.6 13.0 - 17.0 g/dL   HCT 32.9 51.8 - 84.1 %   MCV 88.0 80.0 - 100.0 fL   MCH 29.3 26.0 - 34.0 pg   MCHC 33.3 30.0 - 36.0 g/dL   RDW 66.0 63.0 - 16.0 %   Platelets 338 150 - 400 K/uL   nRBC 0.0 0.0 - 0.2 %   Neutrophils Relative % 70 %   Neutro Abs 7.1 1.7 - 7.7 K/uL   Lymphocytes Relative 24 %   Lymphs Abs 2.4 0.7 - 4.0 K/uL   Monocytes Relative 5 %   Monocytes Absolute 0.5 0.1 - 1.0 K/uL   Eosinophils Relative 1 %   Eosinophils Absolute 0.1 0.0 - 0.5 K/uL   Basophils Relative 0 %   Basophils Absolute 0.0 0.0 - 0.1 K/uL   Immature Granulocytes 0 %   Abs Immature Granulocytes 0.02 0.00 - 0.07 K/uL    Comment: Performed at [facility], [address] [place], [place] [address]  Hepatic function panel     Status: Abnormal   Collection Time: 08/30/21  8:38 PM  Result Value Ref Range   Total Protein 8.2 (H) 6.5 - 8.1 g/dL   Albumin 4.3 3.5 - 5.0 g/dL   AST 15 15 - 41 U/L   ALT 21 0 - 44 U/L   Alkaline Phosphatase 179 (H) 38 - 126 U/L   Total Bilirubin 0.4 0.3 - 1.2 mg/dL   Bilirubin, Direct <3.5 0.0 - 0.2 mg/dL   Indirect Bilirubin NOT CALCULATED 0.3 - 0.9 mg/dL    Comment: Performed at [facility], [address] [place], [place] [address]  Lipase, blood     Status: None   Collection Time: 08/30/21  8:38 PM   Result Value Ref Range   Lipase 21 11 - 51 U/L    Comment: Performed at [facility], [address], [place], [place] [address]  Resp Panel by RT-PCR (Flu A&B, Covid) Nasopharyngeal Swab     Status: None   Collection Time: 08/31/21 12:41 AM   Specimen: Nasopharyngeal Swab; Nasopharyngeal(NP) swabs in vial transport medium  Result Value Ref Range   SARS Coronavirus 2 by RT PCR NEGATIVE NEGATIVE    Comment: (NOTE) SARS-CoV-2 target nucleic acids are NOT DETECTED.  The SARS-CoV-2 RNA is generally detectable in upper respiratory specimens during the acute phase of infection. The lowest concentration of SARS-CoV-2 viral copies this assay can detect is 138 copies/mL. A negative result does not preclude SARS-Cov-2 infection and should not be used as the sole basis for treatment or other patient management decisions. A negative result may occur with  improper specimen collection/handling, submission of specimen other than nasopharyngeal swab, presence of viral mutation(s) within the areas targeted by this assay, and inadequate number of viral copies(<138 copies/mL). A negative result must be combined with clinical observations, patient history, and epidemiological information. The expected result is Negative.  Fact Sheet for  Patients:  BloggerCourse.com  Fact Sheet for Healthcare Providers:  SeriousBroker.it  This test is no t yet approved or cleared by the [place] FDA and  has been authorized for detection and/or diagnosis of SARS-CoV-2 by FDA under an Emergency Use Authorization (EUA). This EUA will remain  in effect (meaning this test can be used) for the duration of the COVID-19 declaration under Section 564(b)(1) of the Act, 21 U.S.C.section 360bbb-3(b)(1), unless the authorization is terminated  or revoked sooner.       Influenza A by PCR NEGATIVE NEGATIVE   Influenza B by PCR NEGATIVE NEGATIVE    Comment: (NOTE) The  Xpert Xpress SARS-CoV-2/FLU/RSV plus assay is intended as an aid in the diagnosis of influenza from Nasopharyngeal swab specimens and should not be used as a sole basis for treatment. Nasal washings and aspirates are unacceptable for Xpert Xpress SARS-CoV-2/FLU/RSV testing.  Fact Sheet for Patients: BloggerCourse.com  Fact Sheet for Healthcare Providers: SeriousBroker.it  This test is not yet approved or cleared by the [place] FDA and has been authorized for detection and/or diagnosis of SARS-CoV-2 by FDA under an Emergency Use Authorization (EUA). This EUA will remain in effect (meaning this test can be used) for the duration of the COVID-19 declaration under Section 564(b)(1) of the Act, 21 U.S.C. section 360bbb-3(b)(1), unless the authorization is terminated or revoked.  Performed at [facility], [address], [place], [place] [address]     Chemistries  Recent Labs  Lab 08/30/21 2038  NA 133*  K 4.0  CL 103  CO2 20*  GLUCOSE 270*  BUN 20  CREATININE 1.94*  CALCIUM 9.6  AST 15  ALT 21  ALKPHOS 179*  BILITOT 0.4   ------------------------------------------------------------------------------------------------------------------  ------------------------------------------------------------------------------------------------------------------ GFR: Estimated Creatinine Clearance: 66.8 mL/min (A) (by C-G formula based on SCr of 1.94 mg/dL (H)). Liver Function Tests: Recent Labs  Lab 08/30/21 2038  AST 15  ALT 21  ALKPHOS 179*  BILITOT 0.4  PROT 8.2*  ALBUMIN 4.3   Recent Labs  Lab 08/30/21 2038  LIPASE 21   No results for input(s): AMMONIA in the last 168 hours. Coagulation Profile: No results for input(s): INR, PROTIME in the last 168 hours. Cardiac Enzymes: No results for input(s): CKTOTAL, CKMB, CKMBINDEX, TROPONINI in the last 168 hours. BNP (last 3 results) No results for input(s): PROBNP in  the last 8760 hours. HbA1C: No results for input(s): HGBA1C in the last 72 hours. CBG: No results for input(s): GLUCAP in the last 168 hours. Lipid Profile: No results for input(s): CHOL, HDL, LDLCALC, TRIG, CHOLHDL, LDLDIRECT in the last 72 hours. Thyroid Function Tests: No results for input(s): TSH, T4TOTAL, FREET4, T3FREE, THYROIDAB in the last 72 hours. Anemia Panel: No results for input(s): VITAMINB12, FOLATE, FERRITIN, TIBC, IRON, RETICCTPCT in the last 72 hours.  --------------------------------------------------------------------------------------------------------------- Urine analysis:    Component Value Date/Time   COLORURINE YELLOW 11/08/2008 0442   APPEARANCEUR CLEAR 11/08/2008 0442   LABSPEC 1.026 11/08/2008 0442   PHURINE 6.0 11/08/2008 0442   GLUCOSEU NEGATIVE 11/08/2008 0442   HGBUR NEGATIVE 11/08/2008 0442   BILIRUBINUR NEGATIVE 11/08/2008 0442   KETONESUR TRACE (A) 11/08/2008 0442   PROTEINUR NEGATIVE 11/08/2008 0442   UROBILINOGEN 1.0 11/08/2008 0442   NITRITE NEGATIVE 11/08/2008 0442   LEUKOCYTESUR SMALL (A) 11/08/2008 0442      Imaging Results:    DG Abd 1 View  Result Date: 08/31/2021 CLINICAL DATA:  Abdominal pain EXAM: ABDOMEN - 1 VIEW COMPARISON:  None. FINDINGS: Prominent loops of bowel  in the central abdomen, although this is favored to reflect sigmoid colon, and therefore likely is nonobstructive. Adynamic colonic ileus is possible. Mild degenerative changes of the lower lumbar spine. IMPRESSION: Possible adynamic colonic ileus. No evidence of small bowel obstruction. Electronically Signed   By: Charline Bills M.D.   On: 08/31/2021 02:17     Assessment & Plan:    Active Problems:   Type 2 diabetes mellitus (HCC)   AKI (acute kidney injury) (HCC)   Ileus (HCC)   Essential hypertension   Obesity   AKI Creatinine at baseline is 0.93, today it is 1.94 Bicarb is 20 BUN 20 Avoid nephrotoxic agents when possible 2 L bolus in the ED,  continue fluids through the night Trend in the a.m. Ileus Nausea, vomiting, abdominal pain KUB shows possible adynamic ileus Continue bowel rest and fluids At admission, patient had returned to baseline and was ready to go home, before his abdominal pain started again and we get the KUB.  If patient returns to baseline, and the abdominal pain does not return no further management necessary.  If the abdominal pain does return consider CT abdomen and pelvis for better characterization Diabetes mellitus type 2 Last hemoglobin A1c was 12 Hold oral hypoglycemics Sliding scale coverage Repeat hemoglobin A1c, if above 10 patient could likely benefit from basal insulin and should follow-up with PCP regarding this Hypertension Patient reports he was taken off all of his hypertensive medications Continue to monitor Will add hydralazine if needed    DVT Prophylaxis-Heparin- SCDs   AM Labs Ordered, also please review Full Orders  Family Communication: Admission, patients condition and plan of care including tests being ordered have been discussed with the patient and lady at bedside who indicate understanding and agree with the plan and Code Status.  Code Status:  FULL  Admission status: Observation  Time spent in minutes : 65   Matther Labell B Zierle-Ghosh DO

## 2021-09-01 ENCOUNTER — Encounter: Payer: Self-pay | Admitting: Registered Nurse

## 2021-09-01 ENCOUNTER — Other Ambulatory Visit: Payer: Self-pay | Admitting: Internal Medicine

## 2021-09-01 DIAGNOSIS — R1084 Generalized abdominal pain: Secondary | ICD-10-CM

## 2021-09-01 DIAGNOSIS — N179 Acute kidney failure, unspecified: Secondary | ICD-10-CM | POA: Diagnosis not present

## 2021-09-01 DIAGNOSIS — I10 Essential (primary) hypertension: Secondary | ICD-10-CM | POA: Diagnosis not present

## 2021-09-01 DIAGNOSIS — E119 Type 2 diabetes mellitus without complications: Secondary | ICD-10-CM

## 2021-09-01 DIAGNOSIS — R109 Unspecified abdominal pain: Secondary | ICD-10-CM

## 2021-09-01 DIAGNOSIS — E6609 Other obesity due to excess calories: Secondary | ICD-10-CM | POA: Diagnosis not present

## 2021-09-01 LAB — GLUCOSE, CAPILLARY
Glucose-Capillary: 196 mg/dL — ABNORMAL HIGH (ref 70–99)
Glucose-Capillary: 170 mg/dL — ABNORMAL HIGH (ref 70–99)

## 2021-09-01 LAB — BASIC METABOLIC PANEL
Anion gap: 7 (ref 5–15)
BUN: 13 mg/dL (ref 6–20)
CO2: 21 mmol/L — ABNORMAL LOW (ref 22–32)
Calcium: 8 mg/dL — ABNORMAL LOW (ref 8.9–10.3)
Chloride: 108 mmol/L (ref 98–111)
Creatinine, Ser: 0.87 mg/dL (ref 0.61–1.24)
GFR, Estimated: >60 mL/min (ref >60)
Glucose, Bld: 209 mg/dL — ABNORMAL HIGH (ref 70–99)
Potassium: 3.4 mmol/L — ABNORMAL LOW (ref 3.5–5.1)
Sodium: 136 mmol/L (ref 135–145)

## 2021-09-01 MED ORDER — INSULIN REGULAR HUMAN 100 UNIT/ML IJ SOLN: 12.0000 [IU] | Freq: Two times a day (BID) | INTRAMUSCULAR | 1 refills | Status: DC

## 2021-09-01 MED ORDER — "INSULIN SYRINGE-NEEDLE U-100 31G X 1/4"" 0.3 ML MISC": 1 refills | Status: DC

## 2021-09-01 MED ORDER — METFORMIN HCL 1000 MG PO TABS: 1000.0000 mg | ORAL_TABLET | Freq: Two times a day (BID) | ORAL | 1 refills | Status: DC

## 2021-09-01 MED ORDER — POTASSIUM CHLORIDE CRYS ER 20 MEQ PO TBCR
40.0000 meq | EXTENDED_RELEASE_TABLET | Freq: Once | ORAL | Status: AC
  Administered 2021-09-01: 40 meq via ORAL
  Filled 2021-09-01: qty 2

## 2021-09-01 MED ORDER — LOSARTAN POTASSIUM 50 MG PO TABS: 50.0000 mg | ORAL_TABLET | Freq: Every day | ORAL | 1 refills | Status: DC

## 2021-09-01 MED ORDER — NOVOLIN 70/30 RELION (70-30) 100 UNIT/ML ~~LOC~~ SUSP: 12.0000 [IU] | Freq: Two times a day (BID) | SUBCUTANEOUS | 3 refills | Status: DC

## 2021-09-01 MED ORDER — ATORVASTATIN CALCIUM 20 MG PO TABS: 20.0000 mg | ORAL_TABLET | Freq: Every day | ORAL | 1 refills | Status: DC

## 2021-09-01 NOTE — TOC Transition Note (Signed)
Transition of Care [facility]) - CM/SW Discharge Note   Patient Details  Name: Larry Moody MRN: [id number] Date of Birth: Aug 28, 1971  Transition of Care [facility] CM/SW Contact:  Leitha Bleak, RN Phone Number: 09/01/2021, 10:07 AM   Clinical Narrative:   Patient admitted with acute kidney injury. TOC consulted for PCP list. Patient states his doctor is in [place]. Just to hard for him to get off work and to appointments as needed. List provided, he will call to find a PCP in [place]. Plan to discharge home today.    Final next level of care: Home/Self Care Barriers to Discharge: Barriers Resolved  Patient Goals and CMS Choice Patient states their goals for this hospitalization and ongoing recovery are:: to go home. CMS Medicare.gov Compare Post Acute Care list provided to:: Patient    Discharge Placement        Home

## 2021-09-01 NOTE — Discharge Summary (Signed)
Physician Discharge Summary  Larry Moody OVF:[id number] DOB: 03-12-1971 DOA: 08/30/2021  PCP: Janeece Agee, NP  Admit date: 08/30/2021 Discharge date: 09/01/2021  Time spent: 30 minutes  Recommendations for Outpatient Follow-up:  Reassess blood pressure and further adjust antihypertensive regimen Repeat A1c in 42-month and closely follow CBGs with further adjustment to hypoglycemia regimen as required. Repeat basic metabolic panel to follow twice renal function.  Discharge Diagnoses:  Active Problems:   Type 2 diabetes mellitus (HCC)   AKI (acute kidney injury) (HCC)   Ileus (HCC)   Essential hypertension   Obesity   Abdominal pain   Discharge Condition: Stable and improved.  Discharged home with instruction to follow-up with PCP in 10 days.  CODE STATUS: Full code.  Diet recommendation: Heart healthy, low calorie and modified carbohydrates.  Filed Weights   08/30/21 2028 08/31/21 0103  Weight: 136.1 kg (!) 138.5 kg    History of present illness:  As per H&P written by Dr. Carren Rang on 08/31/21  Larry Moody  is a 50 y.o. male, with history of diabetes mellitus type 2, and hypertension presents the ED with a chief complaint of nausea vomiting, diarrhea.  Patient reports it started at 4 AM on the same day as presentation.  He has had at least 8-10 episodes of each per his report.  The emesis now looks like yellow liquid, without blood or coffee grounds.  There is also no blood in the stool.  Patient reports that at first he had a knot like feeling in the right lower quadrant, that has since resolved.  He is not sure what made it go away.  His last normal meal was the evening of the 28th.  His last normal bowel movement was also the 28th.  It is normal for him to have bowel movement every other day or every third day.  No one else is sick in the home.  He has not recently been on any antibiotics.  He has not eaten any questionable food.  He is otherwise in his normal state  of health.  Patient smokes cigars twice per month, and he uses a vape.  He declines nicotine patch at this time.  He drinks approximately once per month.  He does not use illicit drugs.  Patient is full code.  In the ED temp 98.5, heart rate 79-100, respiratory rate 18-19, blood pressure 128/90 satting 97% No leukocytosis with a white blood cell count of 10.2, hemoglobin 15.6 Chemistry panel reveals an AKI and hyperglycemia Patient was given Imodium, Zofran, 2 L bolus in the ED Admission was requested for AKI  Hospital Course:  1-acute kidney injury -In the setting of prerenal azotemia and dehydration -Creatinine peaked at 1.94 at time of admission -Excellent response to fluid resuscitation and avoiding nephrotoxic agents -At discharge creatinine 0.85 -Advised to follow heart healthy diet and to continue treatment for his diabetes/antihypertension as instructed.  2-type 2 diabetes with hyperglycemia -Previously no using insulin therapy -Hemoglobin A1c 11.0 -Extended discussion about risk and side effects of uncontrolled diabetes provided. -Patient planning to follow lifestyle modifications and looking to start management with the use of metformin twice a day along with 7030 -Please repeat A1c in 35-month and closely follow patient's CBGs to further adjust hypoglycemia regimen as required.  3-hypertension -Continue the use of losartan -Patient advised to follow heart healthy diet and to maintain adequate hydration. -Vital signs stable at discharge.  4-morbid obesity class II -Body mass index is 38.16 kg/m. -Low calorie diet, portion control  and increase physical activity has been discussed with patient  5-hyperlipidemia -Continue statins.  6-abdominal pain/ileus -Appears to be associated with Larry gastroenteritis -Symptoms completely resolved; at discharge no nausea, no vomiting, no diarrhea, no abdominal pain unable to tolerate diet. -Advised to maintain adequate  hydration.  Procedures: See below for x-ray reports.  Consultations: Diabetes coordinator  Discharge Exam: Vitals:   08/31/21 2032 09/01/21 0344  BP: (!) 138/97 (!) 146/90  Pulse: 85 85  Resp: 18 18  Temp: 98.5 F (36.9 C) 98 F (36.7 C)  SpO2: 98% 98%    General: Afebrile, no chest pain, no nausea, no vomiting, tolerating diet.  Expressed feeling ready to go home. Cardiovascular: S1 and S2, no rubs, no gallops, no JVD. Respiratory: Clear to auscultation bilaterally; no using accessory muscle.  Normal respiratory effort.  Good saturation on room air. Abdomen: Obese, nontender, nondistended, positive bowel sounds Extremities: no edema, no cyanosis, no clubbing   Discharge Instructions   Discharge Instructions     Diet - low sodium heart healthy   Complete by: As directed    Discharge instructions   Complete by: As directed    Take medications as prescribed Maintain adequate hydration Follow modified carbohydrates, low calorie and heart healthy diet  Arrange follow up with PCP in the next 10 days.   Discharge instructions   Complete by: As directed    Arrange follow-up with PCP as instructed Maintain adequate hydration Follow heart healthy, low calorie and modified carbohydrate diet Take medications as prescribed   Increase activity slowly   Complete by: As directed       Allergies as of 09/01/2021       Reactions   Other Nausea And Vomiting   Green peas, watermelon, liver        Medication List     STOP taking these medications    nystatin powder Commonly known as: MYCOSTATIN/NYSTOP       TAKE these medications    atorvastatin 20 MG tablet Commonly known as: LIPITOR Take 1 tablet (20 mg total) by mouth daily.   Insulin Syringe-Needle U-100 31G X 1/4" 0.3 ML Misc Use to inject insulin twice a day as instructed.   losartan 50 MG tablet Commonly known as: COZAAR Take 1 tablet (50 mg total) by mouth daily. What changed: See the new  instructions.   metFORMIN 1000 MG tablet Commonly known as: GLUCOPHAGE Take 1 tablet (1,000 mg total) by mouth 2 (two) times daily with a meal. What changed: Another medication with the same name was removed. Continue taking this medication, and follow the directions you see here.   NovoLIN 70/30 ReliOn (70-30) 100 UNIT/ML injection Generic drug: insulin NPH-regular Human Inject 12 Units into the skin 2 (two) times daily with a meal.       Allergies  Allergen Reactions   Other Nausea And Vomiting    Green peas, watermelon, liver    The results of significant diagnostics from this hospitalization (including imaging, microbiology, ancillary and laboratory) are listed below for reference.    Significant Diagnostic Studies: DG Abd 1 View  Result Date: 08/31/2021 CLINICAL DATA:  Abdominal pain EXAM: ABDOMEN - 1 VIEW COMPARISON:  None. FINDINGS: Prominent loops of bowel in the central abdomen, although this is favored to reflect sigmoid colon, and therefore likely is nonobstructive. Adynamic colonic ileus is possible. Mild degenerative changes of the lower lumbar spine. IMPRESSION: Possible adynamic colonic ileus. No evidence of small bowel obstruction. Electronically Signed   By: Charline Bills  M.D.   On: 08/31/2021 02:17    Microbiology: Recent Results (from the past 240 hour(s))  Resp Panel by RT-PCR (Flu A&B, Covid) Nasopharyngeal Swab     Status: None   Collection Time: 08/31/21 12:41 AM   Specimen: Nasopharyngeal Swab; Nasopharyngeal(NP) swabs in vial transport medium  Result Value Ref Range Status   SARS Coronavirus 2 by RT PCR NEGATIVE NEGATIVE Final    Comment: (NOTE) SARS-CoV-2 target nucleic acids are NOT DETECTED.  The SARS-CoV-2 RNA is generally detectable in upper respiratory specimens during the acute phase of infection. The lowest concentration of SARS-CoV-2 Larry copies this assay can detect is 138 copies/mL. A negative result does not preclude  SARS-Cov-2 infection and should not be used as the sole basis for treatment or other patient management decisions. A negative result may occur with  improper specimen collection/handling, submission of specimen other than nasopharyngeal swab, presence of Larry mutation(s) within the areas targeted by this assay, and inadequate number of Larry copies(<138 copies/mL). A negative result must be combined with clinical observations, patient history, and epidemiological information. The expected result is Negative.  Fact Sheet for Patients:  BloggerCourse.com  Fact Sheet for Healthcare Providers:  SeriousBroker.it  This test is no t yet approved or cleared by the [place] FDA and  has been authorized for detection and/or diagnosis of SARS-CoV-2 by FDA under an Emergency Use Authorization (EUA). This EUA will remain  in effect (meaning this test can be used) for the duration of the COVID-19 declaration under Section 564(b)(1) of the Act, 21 U.S.C.section 360bbb-3(b)(1), unless the authorization is terminated  or revoked sooner.       Influenza A by PCR NEGATIVE NEGATIVE Final   Influenza B by PCR NEGATIVE NEGATIVE Final    Comment: (NOTE) The Xpert Xpress SARS-CoV-2/FLU/RSV plus assay is intended as an aid in the diagnosis of influenza from Nasopharyngeal swab specimens and should not be used as a sole basis for treatment. Nasal washings and aspirates are unacceptable for Xpert Xpress SARS-CoV-2/FLU/RSV testing.  Fact Sheet for Patients: BloggerCourse.com  Fact Sheet for Healthcare Providers: SeriousBroker.it  This test is not yet approved or cleared by the [place] FDA and has been authorized for detection and/or diagnosis of SARS-CoV-2 by FDA under an Emergency Use Authorization (EUA). This EUA will remain in effect (meaning this test can be used) for the duration of  the COVID-19 declaration under Section 564(b)(1) of the Act, 21 U.S.C. section 360bbb-3(b)(1), unless the authorization is terminated or revoked.  Performed at [facility], [address] [place], [place] [address]      Labs: Basic Metabolic Panel: Recent Labs  Lab 08/30/21 2038 08/31/21 0420 09/01/21 0328  NA 133* 136 136  K 4.0 3.6 3.4*  CL 103 108 108  CO2 20* 22 21*  GLUCOSE 270* 185* 209*  BUN 20 20 13   CREATININE 1.94* 1.25* 0.87  CALCIUM 9.6 8.4* 8.0*  MG  --  1.6*  --    Liver Function Tests: Recent Labs  Lab 08/30/21 2038 08/31/21 0420  AST 15 10*  ALT 21 14  ALKPHOS 179* 129*  BILITOT 0.4 0.3  PROT 8.2* 6.2*  ALBUMIN 4.3 3.1*   Recent Labs  Lab 08/30/21 2038  LIPASE 21   CBC: Recent Labs  Lab 08/30/21 2038 08/31/21 0420  WBC 10.2 7.8  NEUTROABS 7.1  --   HGB 15.6 13.2  HCT 46.9 39.9  MCV 88.0 89.3  PLT 338 266   CBG: Recent Labs  Lab  08/31/21 1217 08/31/21 1653 08/31/21 2054 09/01/21 0731 09/01/21 1107  GLUCAP 186* 204* 155* 196* 170*    Signed:  Vassie Loll MD.  [facility] 09/01/2021, 11:21 AM

## 2021-09-01 NOTE — Plan of Care (Signed)

## 2021-09-01 NOTE — Progress Notes (Signed)
Inpatient Diabetes Program Recommendations  AACE/ADA: New Consensus Statement on Inpatient Glycemic Control (2015)  Target Ranges:  Prepandial:   less than 140 mg/dL      Peak postprandial:   less than 180 mg/dL (1-2 hours)      Critically ill patients:  140 - 180 mg/dL   Lab Results  Component Value Date   GLUCAP 170 (H) 09/01/2021   HGBA1C 11.0 (H) 08/31/2021    Review of Glycemic Control  Diabetes history: DM 2 Outpatient Diabetes medications: Metformin Current orders for Inpatient glycemic control:  Novolog 0-15 units tid + hs  Inpatient Diabetes Program Recommendations:    Spoke with pt and wife over the phone briefly regarding A1c level and glucose goals. Pt does not have a meter at home. Pt also reported his doctors office closed but he has recently found where the Doctor relocated at and will make an appointment. Pt also reports see ing the doctor in January without medication changes to his diabetes meds.   Messaged Dr. Gwenlyn Perking regarding d/c insulin as regular insulin was ordered 12 units bid instead of 70/30. Pt also needed a glucometer for home use. Attached education to AVS.  Thanks,  Christena Deem RN, MSN, BC-ADM Inpatient Diabetes Coordinator Team Pager [phone] (8a-5p)

## 2021-09-05 DIAGNOSIS — E785 Hyperlipidemia, unspecified: Secondary | ICD-10-CM | POA: Diagnosis not present

## 2021-09-05 DIAGNOSIS — I10 Essential (primary) hypertension: Secondary | ICD-10-CM | POA: Diagnosis not present

## 2021-09-05 DIAGNOSIS — E1165 Type 2 diabetes mellitus with hyperglycemia: Secondary | ICD-10-CM | POA: Diagnosis not present

## 2021-09-22 DIAGNOSIS — Z125 Encounter for screening for malignant neoplasm of prostate: Secondary | ICD-10-CM | POA: Diagnosis not present

## 2021-09-22 DIAGNOSIS — E785 Hyperlipidemia, unspecified: Secondary | ICD-10-CM | POA: Diagnosis not present

## 2021-09-22 DIAGNOSIS — I10 Essential (primary) hypertension: Secondary | ICD-10-CM | POA: Diagnosis not present

## 2021-09-22 DIAGNOSIS — E1165 Type 2 diabetes mellitus with hyperglycemia: Secondary | ICD-10-CM | POA: Diagnosis not present

## 2021-09-22 DIAGNOSIS — Z0001 Encounter for general adult medical examination with abnormal findings: Secondary | ICD-10-CM | POA: Diagnosis not present

## 2021-09-22 DIAGNOSIS — E1169 Type 2 diabetes mellitus with other specified complication: Secondary | ICD-10-CM | POA: Diagnosis not present

## 2021-11-06 DIAGNOSIS — M545 Low back pain, unspecified: Secondary | ICD-10-CM | POA: Diagnosis not present

## 2021-11-06 DIAGNOSIS — R030 Elevated blood-pressure reading, without diagnosis of hypertension: Secondary | ICD-10-CM | POA: Diagnosis not present

## 2021-11-06 DIAGNOSIS — Z6837 Body mass index (BMI) 37.0-37.9, adult: Secondary | ICD-10-CM | POA: Diagnosis not present

## 2021-11-06 DIAGNOSIS — Z131 Encounter for screening for diabetes mellitus: Secondary | ICD-10-CM | POA: Diagnosis not present

## 2021-11-06 DIAGNOSIS — Z79899 Other long term (current) drug therapy: Secondary | ICD-10-CM | POA: Diagnosis not present

## 2021-11-06 DIAGNOSIS — G8929 Other chronic pain: Secondary | ICD-10-CM | POA: Diagnosis not present

## 2021-11-06 DIAGNOSIS — Z0000 Encounter for general adult medical examination without abnormal findings: Secondary | ICD-10-CM | POA: Diagnosis not present

## 2021-11-06 DIAGNOSIS — Z1159 Encounter for screening for other viral diseases: Secondary | ICD-10-CM | POA: Diagnosis not present

## 2021-11-13 DIAGNOSIS — M2569 Stiffness of other specified joint, not elsewhere classified: Secondary | ICD-10-CM | POA: Diagnosis not present

## 2021-11-13 DIAGNOSIS — R293 Abnormal posture: Secondary | ICD-10-CM | POA: Diagnosis not present

## 2021-11-13 DIAGNOSIS — M545 Low back pain, unspecified: Secondary | ICD-10-CM | POA: Diagnosis not present

## 2021-11-13 DIAGNOSIS — R531 Weakness: Secondary | ICD-10-CM | POA: Diagnosis not present

## 2021-11-18 DIAGNOSIS — R531 Weakness: Secondary | ICD-10-CM | POA: Diagnosis not present

## 2021-11-18 DIAGNOSIS — R293 Abnormal posture: Secondary | ICD-10-CM | POA: Diagnosis not present

## 2021-11-18 DIAGNOSIS — M545 Low back pain, unspecified: Secondary | ICD-10-CM | POA: Diagnosis not present

## 2021-11-18 DIAGNOSIS — M2569 Stiffness of other specified joint, not elsewhere classified: Secondary | ICD-10-CM | POA: Diagnosis not present

## 2021-11-20 DIAGNOSIS — R531 Weakness: Secondary | ICD-10-CM | POA: Diagnosis not present

## 2021-11-20 DIAGNOSIS — M2569 Stiffness of other specified joint, not elsewhere classified: Secondary | ICD-10-CM | POA: Diagnosis not present

## 2021-11-20 DIAGNOSIS — Z6837 Body mass index (BMI) 37.0-37.9, adult: Secondary | ICD-10-CM | POA: Diagnosis not present

## 2021-11-20 DIAGNOSIS — R030 Elevated blood-pressure reading, without diagnosis of hypertension: Secondary | ICD-10-CM | POA: Diagnosis not present

## 2021-11-20 DIAGNOSIS — M545 Low back pain, unspecified: Secondary | ICD-10-CM | POA: Diagnosis not present

## 2021-11-20 DIAGNOSIS — E119 Type 2 diabetes mellitus without complications: Secondary | ICD-10-CM | POA: Diagnosis not present

## 2021-11-20 DIAGNOSIS — Z789 Other specified health status: Secondary | ICD-10-CM | POA: Diagnosis not present

## 2021-11-20 DIAGNOSIS — R293 Abnormal posture: Secondary | ICD-10-CM | POA: Diagnosis not present

## 2021-12-17 DIAGNOSIS — Z1211 Encounter for screening for malignant neoplasm of colon: Secondary | ICD-10-CM | POA: Diagnosis not present

## 2022-01-05 DIAGNOSIS — Z1211 Encounter for screening for malignant neoplasm of colon: Secondary | ICD-10-CM | POA: Diagnosis not present

## 2022-01-05 DIAGNOSIS — E119 Type 2 diabetes mellitus without complications: Secondary | ICD-10-CM | POA: Diagnosis not present

## 2022-01-11 DIAGNOSIS — K635 Polyp of colon: Secondary | ICD-10-CM | POA: Diagnosis not present

## 2022-02-07 DIAGNOSIS — Z6839 Body mass index (BMI) 39.0-39.9, adult: Secondary | ICD-10-CM | POA: Diagnosis not present

## 2022-02-07 DIAGNOSIS — Z1211 Encounter for screening for malignant neoplasm of colon: Secondary | ICD-10-CM | POA: Diagnosis not present

## 2022-02-07 DIAGNOSIS — R030 Elevated blood-pressure reading, without diagnosis of hypertension: Secondary | ICD-10-CM | POA: Diagnosis not present

## 2022-02-07 DIAGNOSIS — K648 Other hemorrhoids: Secondary | ICD-10-CM | POA: Diagnosis not present

## 2022-02-19 DIAGNOSIS — G4733 Obstructive sleep apnea (adult) (pediatric): Secondary | ICD-10-CM | POA: Diagnosis not present

## 2022-02-19 DIAGNOSIS — Z114 Encounter for screening for human immunodeficiency virus [HIV]: Secondary | ICD-10-CM | POA: Diagnosis not present

## 2022-02-19 DIAGNOSIS — E119 Type 2 diabetes mellitus without complications: Secondary | ICD-10-CM | POA: Diagnosis not present

## 2022-02-19 DIAGNOSIS — I10 Essential (primary) hypertension: Secondary | ICD-10-CM | POA: Diagnosis not present

## 2022-02-19 DIAGNOSIS — N529 Male erectile dysfunction, unspecified: Secondary | ICD-10-CM | POA: Diagnosis not present

## 2022-02-19 DIAGNOSIS — Z6841 Body Mass Index (BMI) 40.0 and over, adult: Secondary | ICD-10-CM | POA: Diagnosis not present

## 2022-02-19 DIAGNOSIS — R030 Elevated blood-pressure reading, without diagnosis of hypertension: Secondary | ICD-10-CM | POA: Diagnosis not present

## 2022-03-05 DIAGNOSIS — R948 Abnormal results of function studies of other organs and systems: Secondary | ICD-10-CM | POA: Diagnosis not present

## 2022-03-05 DIAGNOSIS — E119 Type 2 diabetes mellitus without complications: Secondary | ICD-10-CM | POA: Diagnosis not present

## 2022-03-05 DIAGNOSIS — R748 Abnormal levels of other serum enzymes: Secondary | ICD-10-CM | POA: Diagnosis not present

## 2022-03-05 DIAGNOSIS — R030 Elevated blood-pressure reading, without diagnosis of hypertension: Secondary | ICD-10-CM | POA: Diagnosis not present

## 2022-03-05 DIAGNOSIS — I10 Essential (primary) hypertension: Secondary | ICD-10-CM | POA: Diagnosis not present

## 2022-03-05 DIAGNOSIS — Z6841 Body Mass Index (BMI) 40.0 and over, adult: Secondary | ICD-10-CM | POA: Diagnosis not present

## 2022-03-20 DIAGNOSIS — G4726 Circadian rhythm sleep disorder, shift work type: Secondary | ICD-10-CM | POA: Diagnosis not present

## 2022-03-20 DIAGNOSIS — R400 Somnolence: Secondary | ICD-10-CM | POA: Diagnosis not present

## 2022-03-20 DIAGNOSIS — I10 Essential (primary) hypertension: Secondary | ICD-10-CM | POA: Diagnosis not present

## 2022-03-20 DIAGNOSIS — E119 Type 2 diabetes mellitus without complications: Secondary | ICD-10-CM | POA: Diagnosis not present

## 2022-03-20 DIAGNOSIS — R030 Elevated blood-pressure reading, without diagnosis of hypertension: Secondary | ICD-10-CM | POA: Diagnosis not present

## 2022-03-20 DIAGNOSIS — Z6841 Body Mass Index (BMI) 40.0 and over, adult: Secondary | ICD-10-CM | POA: Diagnosis not present

## 2022-04-10 DIAGNOSIS — R0902 Hypoxemia: Secondary | ICD-10-CM | POA: Diagnosis not present

## 2022-04-10 DIAGNOSIS — G4736 Sleep related hypoventilation in conditions classified elsewhere: Secondary | ICD-10-CM | POA: Diagnosis not present

## 2022-04-10 DIAGNOSIS — G4733 Obstructive sleep apnea (adult) (pediatric): Secondary | ICD-10-CM | POA: Diagnosis not present

## 2022-05-01 DIAGNOSIS — G4726 Circadian rhythm sleep disorder, shift work type: Secondary | ICD-10-CM | POA: Diagnosis not present

## 2022-05-01 DIAGNOSIS — E119 Type 2 diabetes mellitus without complications: Secondary | ICD-10-CM | POA: Diagnosis not present

## 2022-05-01 DIAGNOSIS — I10 Essential (primary) hypertension: Secondary | ICD-10-CM | POA: Diagnosis not present

## 2022-05-01 DIAGNOSIS — Z6841 Body Mass Index (BMI) 40.0 and over, adult: Secondary | ICD-10-CM | POA: Diagnosis not present

## 2022-05-01 DIAGNOSIS — G4733 Obstructive sleep apnea (adult) (pediatric): Secondary | ICD-10-CM | POA: Diagnosis not present

## 2022-05-03 ENCOUNTER — Emergency Department (HOSPITAL_COMMUNITY): Payer: BC Managed Care – PPO

## 2022-05-03 ENCOUNTER — Other Ambulatory Visit: Payer: Self-pay

## 2022-05-03 ENCOUNTER — Emergency Department (HOSPITAL_COMMUNITY)
Admission: EM | Admit: 2022-05-03 | Discharge: 2022-05-03 | Disposition: A | Discharge status: Home / Self Care | Payer: BC Managed Care – PPO | Attending: Emergency Medicine | Admitting: Emergency Medicine

## 2022-05-03 ENCOUNTER — Encounter (HOSPITAL_COMMUNITY): Payer: Self-pay

## 2022-05-03 DIAGNOSIS — M25512 Pain in left shoulder: Secondary | ICD-10-CM | POA: Diagnosis not present

## 2022-05-03 DIAGNOSIS — R9431 Abnormal electrocardiogram [ECG] [EKG]: Secondary | ICD-10-CM | POA: Diagnosis not present

## 2022-05-03 MED ORDER — IBUPROFEN 800 MG PO TABS: 800.0000 mg | ORAL_TABLET | Freq: Three times a day (TID) | ORAL | 0 refills | Status: DC

## 2022-05-03 MED ORDER — KETOROLAC TROMETHAMINE 15 MG/ML IJ SOLN: 15.0000 mg | Freq: Once | INTRAMUSCULAR | Status: DC

## 2022-05-03 MED ORDER — HYDROCODONE-ACETAMINOPHEN 5-325 MG PO TABS
2.0000 | ORAL_TABLET | Freq: Once | ORAL | Status: AC
  Administered 2022-05-03: 2 via ORAL
  Filled 2022-05-03: qty 2

## 2022-05-03 NOTE — Discharge Instructions (Signed)
Please take ibuprofen every 8 hours with food.  Would like for you to follow-up with orthopedics for further evaluation if this does not get better.  Please return to the emergency room if any worsening symptoms.

## 2022-05-03 NOTE — ED Triage Notes (Signed)
Pt presents with 1 week hx of L shoulder pain that has gradually gotten worse. Pt reports the pain radiates under his L shoulder blade and is worse with certain positions. Pt states the pain is palliated when he raises his arm over his head.

## 2022-05-03 NOTE — ED Provider Notes (Signed)
[facility] EMERGENCY DEPARTMENT Provider Note   CSN: [id number] Arrival date & time: 05/03/22  1446     History Chief Complaint  Patient presents with   Shoulder Pain    Larry Moody is a 51 y.o. male patient presents the emerge department with a 1 week history of atraumatic left shoulder pain.  Patient works as a Curator.  He states that the pain in the shoulder migrates and started off in the left trapezius muscle and then went to the rear deltoid.  He states that his pain is relieved with movement of the shoulder.  Denies any fever or chills.   Shoulder Pain      Home Medications Prior to Admission medications   Medication Sig Start Date End Date Taking? Authorizing Provider  ibuprofen (ADVIL) 800 MG tablet Take 1 tablet (800 mg total) by mouth 3 (three) times daily. 05/03/22  Yes Meredeth Ide, [place] Soy M, PA-C  atorvastatin (LIPITOR) 20 MG tablet Take 1 tablet (20 mg total) by mouth daily. 09/01/21   Vassie Loll, MD  insulin NPH-regular Human (NOVOLIN 70/30 RELION) (70-30) 100 UNIT/ML injection Inject 12 Units into the skin 2 (two) times daily with a meal. 09/01/21   Vassie Loll, MD  Insulin Syringe-Needle U-100 31G X 1/4" 0.3 ML MISC Use to inject insulin twice a day as instructed. 09/01/21   Vassie Loll, MD  losartan (COZAAR) 50 MG tablet Take 1 tablet (50 mg total) by mouth daily. 09/01/21   Vassie Loll, MD  metFORMIN (GLUCOPHAGE) 1000 MG tablet Take 1 tablet (1,000 mg total) by mouth 2 (two) times daily with a meal. 09/01/21   Vassie Loll, MD      Allergies    Other    Review of Systems   Review of Systems  Physical Exam Updated Vital Signs BP (!) 180/102 (BP Location: Right Arm)   Pulse (!) 102   Temp 97.8 F (36.6 C) (Oral)   Resp 20   Ht 6\' 3"  (1.905 m)   Wt (!) 145.2 kg   SpO2 99%   BMI 40.00 kg/m  Physical Exam Vitals and nursing note reviewed.  Constitutional:      Appearance: Normal appearance.  HENT:     Head: Normocephalic and  atraumatic.  Eyes:     General:        Right eye: No discharge.        Left eye: No discharge.     Conjunctiva/sclera: Conjunctivae normal.  Pulmonary:     Effort: Pulmonary effort is normal.  Musculoskeletal:     Comments: Good grip strength bilaterally.  2+ radial pulse felt on the left.  Neurovascularly intact with good cap refill distally.  Patient has some point tenderness to the rear deltoid.  Negative Neer's and empty can test.  Negative speeds test.  Skin:    General: Skin is warm and dry.     Findings: No rash.  Neurological:     General: No focal deficit present.     Mental Status: He is alert.  Psychiatric:        Mood and Affect: Mood normal.        Behavior: Behavior normal.     ED Results / Procedures / Treatments   Labs (all labs ordered are listed, but only abnormal results are displayed) Labs Reviewed - No data to display  EKG None  Radiology DG Shoulder Left  Result Date: 05/03/2022 CLINICAL DATA:  Left shoulder pain for 1 week.  No injury EXAM: LEFT SHOULDER -  2+ VIEW COMPARISON:  None Available. FINDINGS: There is no evidence of fracture or dislocation. There is no evidence of significant arthropathy or other focal bone abnormality. Soft tissues are unremarkable. IMPRESSION: Negative. Electronically Signed   By: Duanne Moody D.O.   On: 05/03/2022 15:52    Procedures Procedures    Medications Ordered in ED Medications  HYDROcodone-acetaminophen (NORCO/VICODIN) 5-325 MG per tablet 2 tablet (has no administration in time range)    ED Course/ Medical Decision Making/ A&P Clinical Course as of 05/03/22 1626  Sun May 03, 2022  1622 DG Shoulder Left No evidence of fracture or dislocation.  I personally ordered and interpreted this image.  I do agree with the radiology interpretation. [CF]  1622 EKG 12-Lead Normal sinus rhythm.  No signs of active ischemia. [CF]    Clinical Course User Index [CF] Teressa Lower, PA-C                            Medical Decision Making Larry Moody is a 51 y.o. male patient who presents to the emergency department today for further evaluation of left-sided shoulder pain.  This is atraumatic and I have a low suspicion for any fracture dislocations.  We will get imaging to confirm.  Patient has negative rotator Moody signs.  I suspect that patient might have overuse type injury with the bicep tendon.   Amount and/or Complexity of Data Reviewed Radiology: ordered.  Risk Prescription drug management. Risk Details: We will give him 2 of Norco and have him follow-up with orthopedics.  I Ernie Hew write him a prescription for 800 mg of ibuprofen.  Patient states that his pain is improved when he is working and then when he is resting that is when the throbbing sensation starts.  On reevaluation, he is having bicipital groove tenderness.    Final Clinical Impression(s) / ED Diagnoses Final diagnoses:  Acute pain of left shoulder    Rx / DC Orders ED Discharge Orders          Ordered    ibuprofen (ADVIL) 800 MG tablet  3 times daily        05/03/22 1625              Honor Loh [place], [place] 05/03/22 1626    Jacalyn Lefevre, MD 05/04/22 [phone]

## 2022-05-15 DIAGNOSIS — G4733 Obstructive sleep apnea (adult) (pediatric): Secondary | ICD-10-CM | POA: Diagnosis not present

## 2022-06-15 DIAGNOSIS — G4733 Obstructive sleep apnea (adult) (pediatric): Secondary | ICD-10-CM | POA: Diagnosis not present

## 2022-06-19 DIAGNOSIS — I10 Essential (primary) hypertension: Secondary | ICD-10-CM | POA: Diagnosis not present

## 2022-06-19 DIAGNOSIS — Z6838 Body mass index (BMI) 38.0-38.9, adult: Secondary | ICD-10-CM | POA: Diagnosis not present

## 2022-06-19 DIAGNOSIS — N529 Male erectile dysfunction, unspecified: Secondary | ICD-10-CM | POA: Diagnosis not present

## 2022-06-19 DIAGNOSIS — R030 Elevated blood-pressure reading, without diagnosis of hypertension: Secondary | ICD-10-CM | POA: Diagnosis not present

## 2022-06-19 DIAGNOSIS — E119 Type 2 diabetes mellitus without complications: Secondary | ICD-10-CM | POA: Diagnosis not present

## 2022-06-19 DIAGNOSIS — E785 Hyperlipidemia, unspecified: Secondary | ICD-10-CM | POA: Diagnosis not present

## 2022-07-16 DIAGNOSIS — G4733 Obstructive sleep apnea (adult) (pediatric): Secondary | ICD-10-CM | POA: Diagnosis not present

## 2022-08-15 DIAGNOSIS — G4733 Obstructive sleep apnea (adult) (pediatric): Secondary | ICD-10-CM | POA: Diagnosis not present

## 2022-09-15 DIAGNOSIS — G4733 Obstructive sleep apnea (adult) (pediatric): Secondary | ICD-10-CM | POA: Diagnosis not present

## 2022-10-15 DIAGNOSIS — G4733 Obstructive sleep apnea (adult) (pediatric): Secondary | ICD-10-CM | POA: Diagnosis not present

## 2022-11-15 DIAGNOSIS — G4733 Obstructive sleep apnea (adult) (pediatric): Secondary | ICD-10-CM | POA: Diagnosis not present

## 2023-05-31 ENCOUNTER — Ambulatory Visit: Payer: BC Managed Care – PPO | Admitting: Family Medicine

## 2023-05-31 ENCOUNTER — Encounter: Payer: Self-pay | Admitting: Family Medicine

## 2023-05-31 VITALS — BP 136/91 | HR 100 | Ht 76.0 in | Wt 312.0 lb

## 2023-05-31 DIAGNOSIS — Z1329 Encounter for screening for other suspected endocrine disorder: Secondary | ICD-10-CM

## 2023-05-31 DIAGNOSIS — F5102 Adjustment insomnia: Secondary | ICD-10-CM

## 2023-05-31 DIAGNOSIS — Z125 Encounter for screening for malignant neoplasm of prostate: Secondary | ICD-10-CM | POA: Diagnosis not present

## 2023-05-31 DIAGNOSIS — I10 Essential (primary) hypertension: Secondary | ICD-10-CM | POA: Diagnosis not present

## 2023-05-31 DIAGNOSIS — G4700 Insomnia, unspecified: Secondary | ICD-10-CM | POA: Insufficient documentation

## 2023-05-31 DIAGNOSIS — E559 Vitamin D deficiency, unspecified: Secondary | ICD-10-CM | POA: Diagnosis not present

## 2023-05-31 DIAGNOSIS — Z131 Encounter for screening for diabetes mellitus: Secondary | ICD-10-CM

## 2023-05-31 DIAGNOSIS — Z1159 Encounter for screening for other viral diseases: Secondary | ICD-10-CM

## 2023-05-31 DIAGNOSIS — Z13220 Encounter for screening for lipoid disorders: Secondary | ICD-10-CM | POA: Diagnosis not present

## 2023-05-31 DIAGNOSIS — Z114 Encounter for screening for human immunodeficiency virus [HIV]: Secondary | ICD-10-CM | POA: Diagnosis not present

## 2023-05-31 DIAGNOSIS — E1165 Type 2 diabetes mellitus with hyperglycemia: Secondary | ICD-10-CM | POA: Diagnosis not present

## 2023-05-31 MED ORDER — HYDROXYZINE PAMOATE 25 MG PO CAPS: 25.0000 mg | ORAL_CAPSULE | Freq: Three times a day (TID) | ORAL | 3 refills | Status: AC | PRN

## 2023-05-31 MED ORDER — OLMESARTAN MEDOXOMIL 20 MG PO TABS: 10.0000 mg | ORAL_TABLET | Freq: Every day | ORAL | 3 refills | Status: DC

## 2023-05-31 NOTE — Assessment & Plan Note (Addendum)
Initial visit Patient reported has not taken medications for the past few months. Labs ordered in today's visit- awaiting results will follow up. Advise to follow a DASH diet which includes vegetables,fruits,whole grains, fat free or low fat diary,fish,poultry,beans,nuts and seeds,vegetable oils. Find an activity that you will enjoy and start to be active at least 5 days a week for 30 minutes each day.

## 2023-05-31 NOTE — Progress Notes (Signed)
New Patient Office Visit   Subjective   Patient ID: Larry Moody, male    DOB: 04-05-1971  Age: 52 y.o. MRN: [id number]  CC:  Chief Complaint  Patient presents with   Establish Care    Patient is here to establish care. Wants to discuss HTN and DM.     HPI Larry Moody 51 year old male, presents to establish care. He  has a past medical history of Diabetes mellitus without complication (HCC) and Hypertension.  Hypertension This is a chronic problem. Blood pressure has been waxing and waning since onset. Blood pressure is uncontrolled. Associated symptoms include chest discomfort. Pertinent negatives include no blurred vision, palpitations or shortness of breath. Risk factors for coronary artery disease include diabetes mellitus, dyslipidemia and obesity. Past treatments include amlodipine. The current treatment provides no improvement. Compliance problems include diet and exercise.    Insomnia Primary symptoms: difficulty falling asleep, frequent awakening. The current episode started more than one month. The problem occurs nightly. The problem has been gradually worsening since onset. The symptoms are aggravated by work stress. How many beverages per day that contain caffeine: 0 - 1.  The symptoms are relieved by darkened room and quiet environment. Treatments tried: Melatonin OTC. The treatment provided no relief. Typical bedtime:  Shift work-varies.  How long after going to bed to you fall asleep: over an hour. PMH includes: hypertension.         Outpatient Encounter Medications as of 05/31/2023  Medication Sig   hydrOXYzine (VISTARIL) 25 MG capsule Take 1 capsule (25 mg total) by mouth every 8 (eight) hours as needed.   olmesartan (BENICAR) 20 MG tablet Take 0.5 tablets (10 mg total) by mouth daily.   [DISCONTINUED] glipiZIDE (GLUCOTROL XL) 2.5 MG 24 hr tablet Take 2.5 mg by mouth daily with breakfast.   [DISCONTINUED] atorvastatin (LIPITOR) 20 MG tablet Take 1 tablet (20  mg total) by mouth daily. (Patient not taking: Reported on 05/31/2023)   [DISCONTINUED] ibuprofen (ADVIL) 800 MG tablet Take 1 tablet (800 mg total) by mouth 3 (three) times daily. (Patient not taking: Reported on 05/31/2023)   [DISCONTINUED] insulin NPH-regular Human (NOVOLIN 70/30 RELION) (70-30) 100 UNIT/ML injection Inject 12 Units into the skin 2 (two) times daily with a meal. (Patient not taking: Reported on 05/31/2023)   [DISCONTINUED] Insulin Syringe-Needle U-100 31G X 1/4" 0.3 ML MISC Use to inject insulin twice a day as instructed. (Patient not taking: Reported on 05/31/2023)   [DISCONTINUED] losartan (COZAAR) 50 MG tablet Take 1 tablet (50 mg total) by mouth daily. (Patient not taking: Reported on 05/31/2023)   [DISCONTINUED] metFORMIN (GLUCOPHAGE) 1000 MG tablet Take 1 tablet (1,000 mg total) by mouth 2 (two) times daily with a meal.   No facility-administered encounter medications on file as of 05/31/2023.    History reviewed. No pertinent surgical history.  Review of Systems  Constitutional:  Negative for chills and fever.  Eyes:  Positive for blurred vision.  Respiratory:  Negative for shortness of breath.   Cardiovascular:  Negative for leg swelling.  Gastrointestinal:  Negative for abdominal pain.  Neurological:  Negative for dizziness.      Objective    BP (!) 136/91   Pulse 100   Ht 6\' 4"  (1.93 m)   Wt (!) 312 lb (141.5 kg)   SpO2 95%   BMI 37.98 kg/m   Physical Exam Vitals reviewed.  Constitutional:      General: He is not in acute distress.  Appearance: Normal appearance. He is not ill-appearing, toxic-appearing or diaphoretic.  HENT:     Head: Normocephalic.     Right Ear: Tympanic membrane normal.     Left Ear: Tympanic membrane normal.     Nose: Nose normal.     Mouth/Throat:     Mouth: Mucous membranes are moist.  Eyes:     General:        Right eye: No discharge.        Left eye: No discharge.     Conjunctiva/sclera: Conjunctivae normal.      Pupils: Pupils are equal, round, and reactive to light.  Cardiovascular:     Rate and Rhythm: Normal rate.     Pulses: Normal pulses.     Heart sounds: Normal heart sounds.  Pulmonary:     Effort: Pulmonary effort is normal. No respiratory distress.     Breath sounds: Normal breath sounds.  Abdominal:     General: Bowel sounds are normal.     Palpations: Abdomen is soft.     Tenderness: There is no abdominal tenderness. There is no right CVA tenderness, left CVA tenderness or guarding.  Musculoskeletal:        General: Normal range of motion.     Cervical back: Normal range of motion.  Skin:    General: Skin is warm and dry.     Capillary Refill: Capillary refill takes less than 2 seconds.  Neurological:     General: No focal deficit present.     Mental Status: He is alert and oriented to person, place, and time.     Coordination: Coordination normal.     Gait: Gait normal.  Psychiatric:        Mood and Affect: Mood normal.        Behavior: Behavior normal.        Thought Content: Thought content normal.       Assessment & Plan:  Primary hypertension -     BMP8+EGFR -     CBC with Differential/Platelet -     Microalbumin / creatinine urine ratio -     Olmesartan Medoxomil; Take 0.5 tablets (10 mg total) by mouth daily.  Dispense: 30 tablet; Refill: 3  Vitamin D deficiency -     VITAMIN D 25 Hydroxy (Vit-D Deficiency, Fractures)  Screening for HIV (human immunodeficiency virus) -     HIV Antibody (routine testing w rflx)  Need for hepatitis C screening test -     Hepatitis C antibody  Screening for thyroid disorder -     TSH + free T4  Screening for lipid disorders -     Lipid panel  Screening for diabetes mellitus -     Hemoglobin A1c  Adjustment insomnia Assessment & Plan: Trial on Hydroxyzine 25 mg bedtime  Explained to go to bed at the same time each night and get up at the same time each morning, including on the weekends. Make sure your bedroom is quiet,  dark, relaxing, and at a comfortable temperature. Remove electronic devices, such as TVs, computers, and smart phones, from the bedroom.   Orders: -     hydrOXYzine Pamoate; Take 1 capsule (25 mg total) by mouth every 8 (eight) hours as needed.  Dispense: 30 capsule; Refill: 3  Essential hypertension Assessment & Plan: Vitals:   05/31/23 0912 05/31/23 0913  BP: (!) 140/87 (!) 136/91   Trial on Olmesartan 10 mg once daily Follow up in 6 weeks with at home blood pressure readings  Continued discussion on DASH diet, low sodium diet and maintain a exercise routine for 150 minutes per week.    Type 2 diabetes mellitus with hyperglycemia, without long-term current use of insulin [facility]) Assessment & Plan: Initial visit Patient reported has not taken medications for the past few months. Labs ordered in today's visit- awaiting results will follow up. Advise to follow a DASH diet which includes vegetables,fruits,whole grains, fat free or low fat diary,fish,poultry,beans,nuts and seeds,vegetable oils. Find an activity that you will enjoy and start to be active at least 5 days a week for 30 minutes each day.      Return in about 6 weeks (around 07/12/2023), or if symptoms worsen or fail to improve, for hypertension, re-check blood pressure, Insomnia.   Cruzita Lederer Newman Nip, FNP

## 2023-05-31 NOTE — Assessment & Plan Note (Signed)
Vitals:   05/31/23 0912 05/31/23 0913  BP: (!) 140/87 (!) 136/91   Trial on Olmesartan 10 mg once daily Follow up in 6 weeks with at home blood pressure readings Continued discussion on DASH diet, low sodium diet and maintain a exercise routine for 150 minutes per week.

## 2023-05-31 NOTE — Patient Instructions (Signed)

## 2023-05-31 NOTE — Assessment & Plan Note (Signed)
Trial on Hydroxyzine 25 mg bedtime  Explained to go to bed at the same time each night and get up at the same time each morning, including on the weekends. Make sure your bedroom is quiet, dark, relaxing, and at a comfortable temperature. Remove electronic devices, such as TVs, computers, and smart phones, from the bedroom.

## 2023-06-01 ENCOUNTER — Other Ambulatory Visit: Payer: Self-pay | Admitting: Family Medicine

## 2023-06-01 DIAGNOSIS — Z1329 Encounter for screening for other suspected endocrine disorder: Secondary | ICD-10-CM

## 2023-06-02 ENCOUNTER — Encounter: Payer: Self-pay | Admitting: Family Medicine

## 2023-06-03 ENCOUNTER — Other Ambulatory Visit: Payer: Self-pay | Admitting: Family Medicine

## 2023-06-03 DIAGNOSIS — E782 Mixed hyperlipidemia: Secondary | ICD-10-CM

## 2023-06-03 DIAGNOSIS — Z1329 Encounter for screening for other suspected endocrine disorder: Secondary | ICD-10-CM

## 2023-06-03 MED ORDER — GLIMEPIRIDE 2 MG PO TABS: 2.0000 mg | ORAL_TABLET | Freq: Every day | ORAL | 1 refills | Status: DC

## 2023-06-03 MED ORDER — METFORMIN HCL 1000 MG PO TABS: 1000.0000 mg | ORAL_TABLET | Freq: Two times a day (BID) | ORAL | 3 refills | Status: DC

## 2023-06-03 MED ORDER — LANCET DEVICE MISC: 1.0000 | Freq: Three times a day (TID) | 0 refills | Status: AC

## 2023-06-03 MED ORDER — BLOOD GLUCOSE TEST VI STRP: 1.0000 | ORAL_STRIP | Freq: Three times a day (TID) | 2 refills | Status: AC

## 2023-06-03 MED ORDER — ATORVASTATIN CALCIUM 40 MG PO TABS: 40.0000 mg | ORAL_TABLET | Freq: Every day | ORAL | 3 refills | Status: DC

## 2023-06-03 MED ORDER — OZEMPIC (0.25 OR 0.5 MG/DOSE) 2 MG/3ML ~~LOC~~ SOPN: 0.5000 mg | PEN_INJECTOR | SUBCUTANEOUS | 0 refills | Status: DC

## 2023-06-03 MED ORDER — LANCETS MISC. MISC: 1.0000 | Freq: Three times a day (TID) | 0 refills | Status: AC

## 2023-06-03 MED ORDER — BLOOD GLUCOSE MONITORING SUPPL DEVI: 1.0000 | Freq: Three times a day (TID) | 0 refills | Status: DC

## 2023-06-03 MED ORDER — VITAMIN D3 50 MCG (2000 UT) PO CAPS: 2000.0000 [IU] | ORAL_CAPSULE | Freq: Every day | ORAL | 1 refills | Status: DC

## 2023-06-12 ENCOUNTER — Encounter: Payer: Self-pay | Admitting: Family Medicine

## 2023-06-14 ENCOUNTER — Other Ambulatory Visit: Payer: Self-pay | Admitting: Family Medicine

## 2023-07-02 ENCOUNTER — Other Ambulatory Visit: Payer: Self-pay | Admitting: Family Medicine

## 2023-07-02 ENCOUNTER — Encounter: Payer: Self-pay | Admitting: Family Medicine

## 2023-07-02 IMAGING — DX DG ABDOMEN 1V
2 series · 2 of 2 positions shown · non-contrast
Comparison: None.

CLINICAL DATA: Abdominal pain

EXAM:
ABDOMEN - 1 VIEW

[abdomen supine grid (1 of 2)]
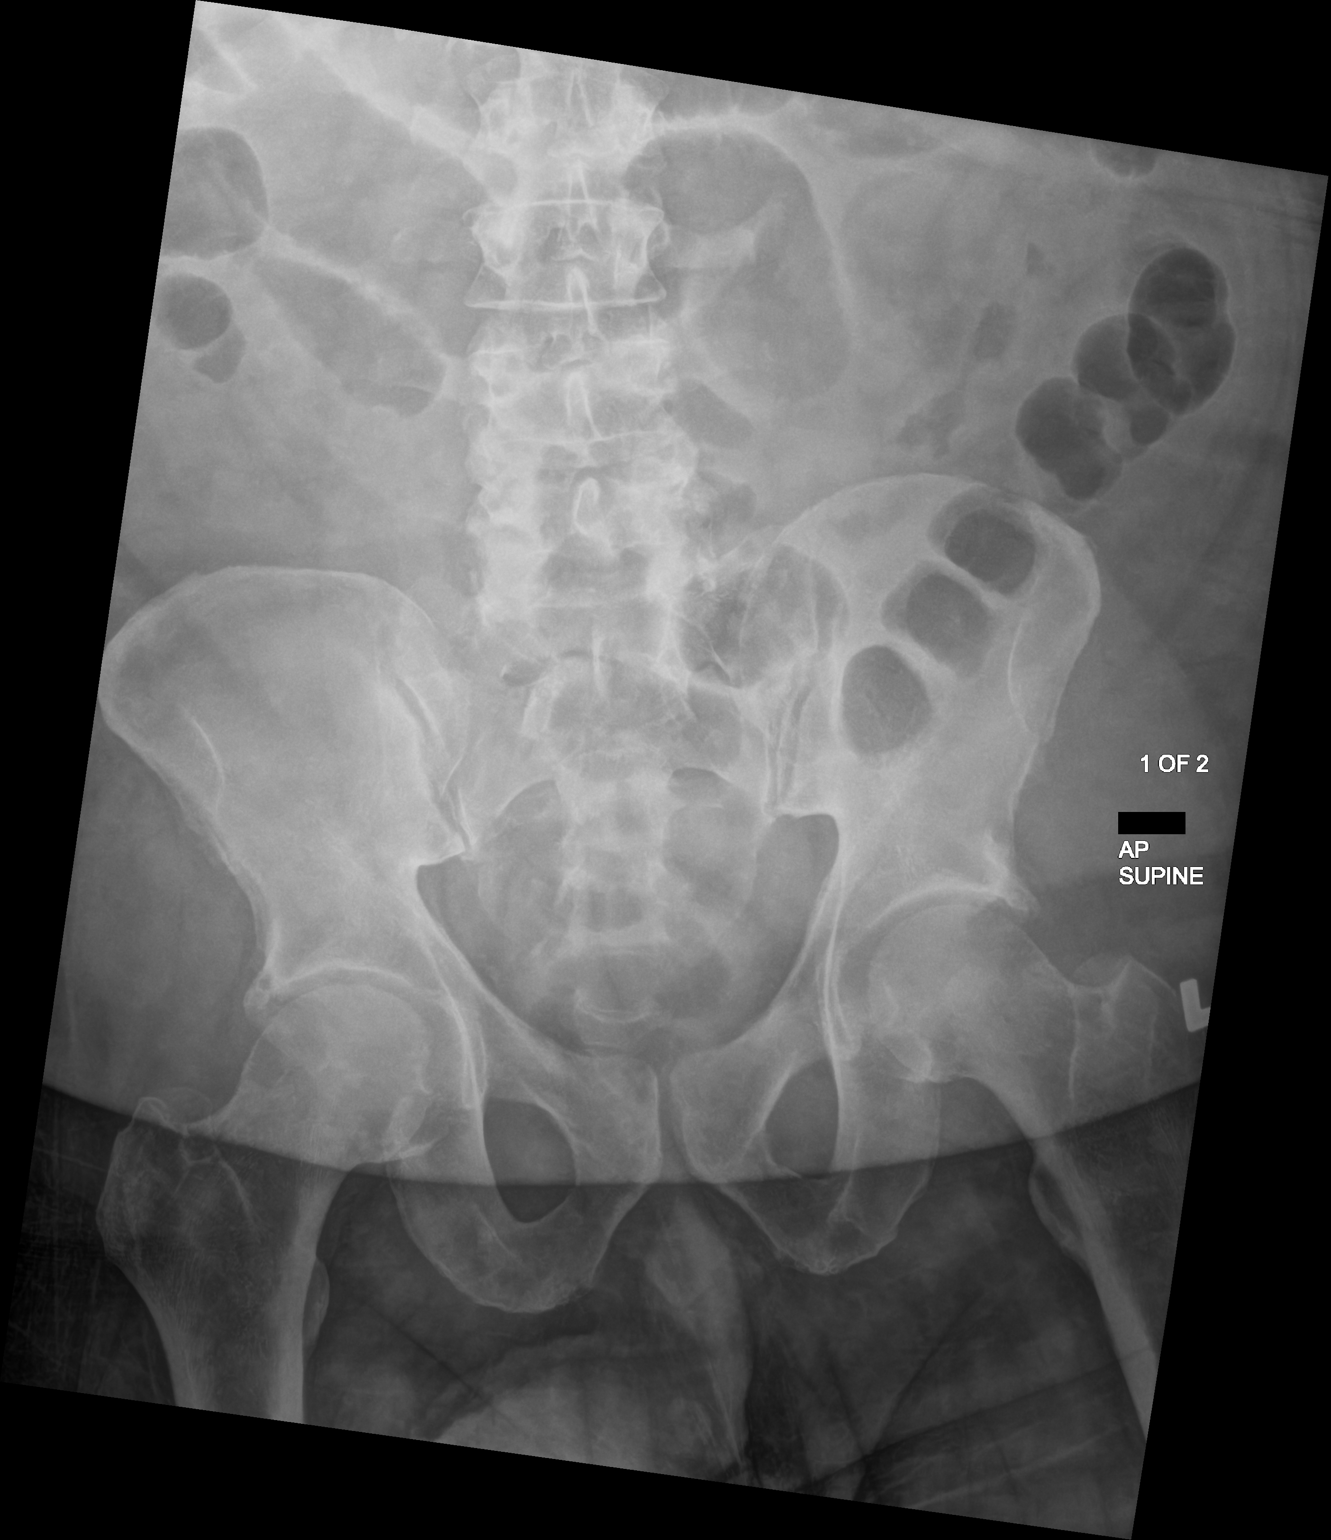

[abdomen supine grid (2 of 2)]
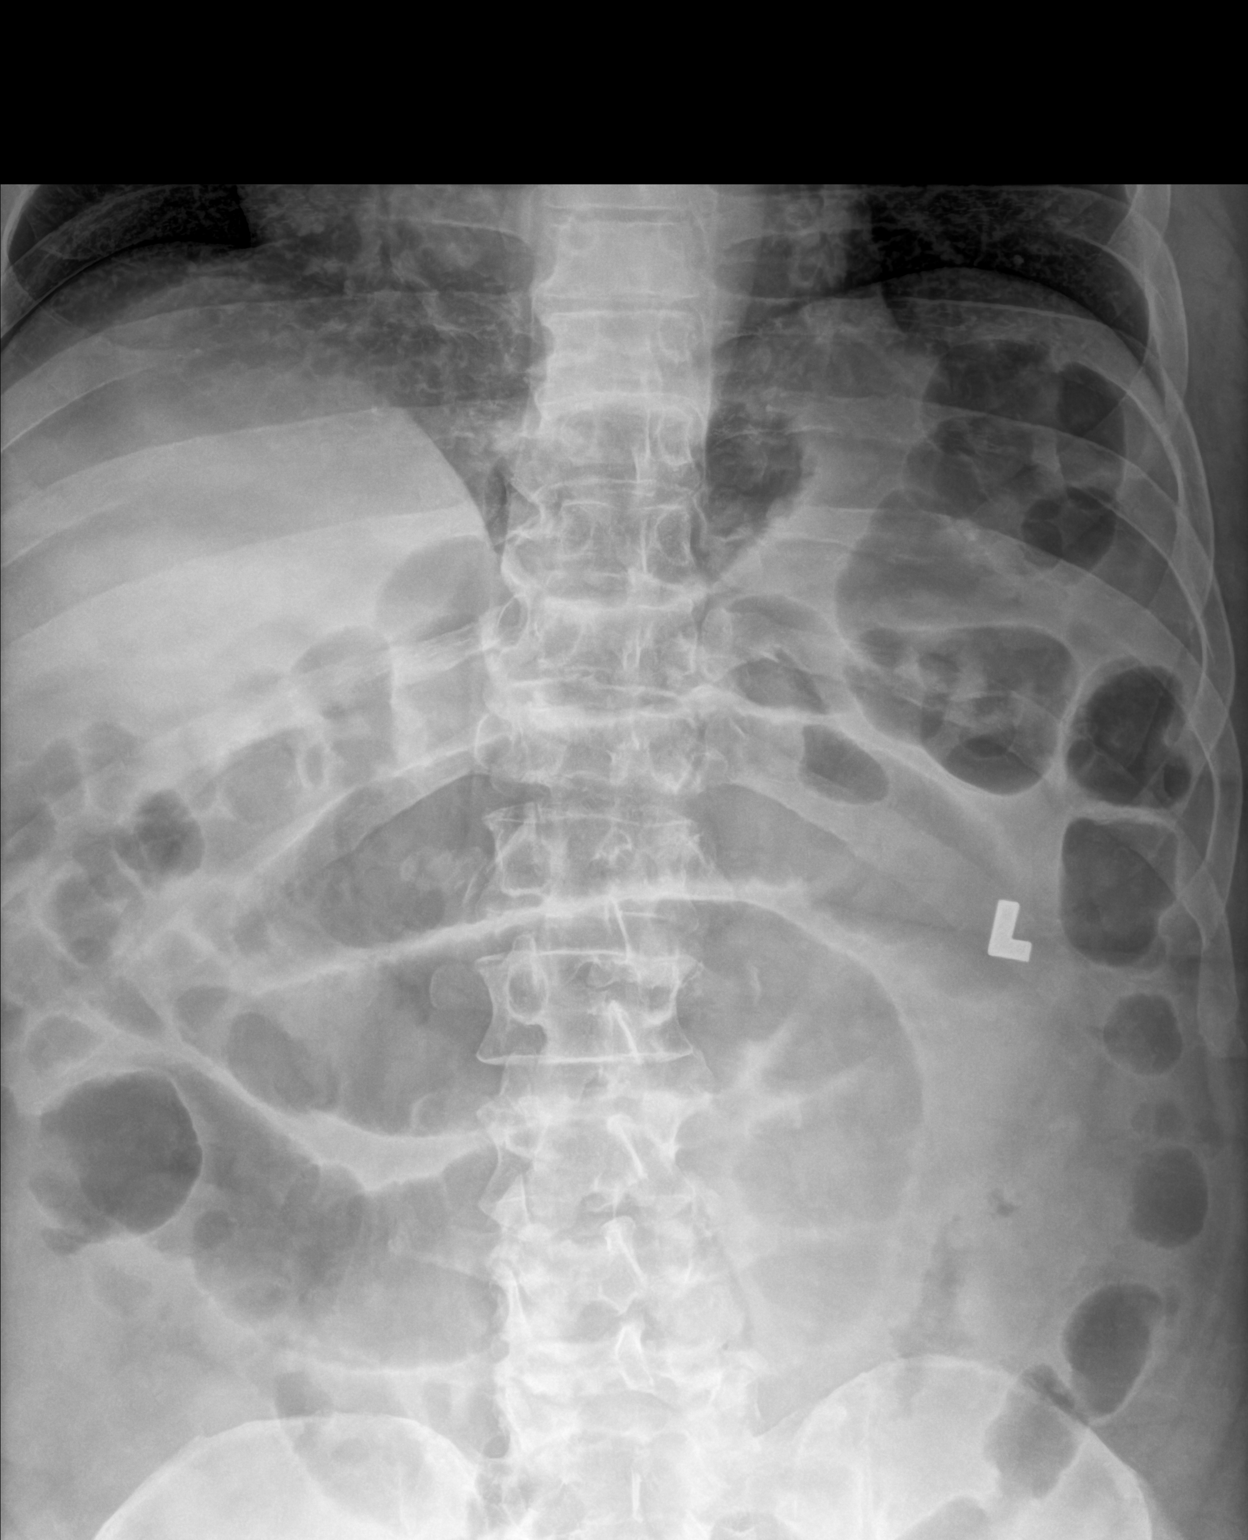

[2 of 2 positions shown; findings below may reference images not displayed]

FINDINGS: Prominent loops of bowel in the central abdomen, although this is
favored to reflect sigmoid colon, and therefore likely is
nonobstructive. Adynamic colonic ileus is possible.

Mild degenerative changes of the lower lumbar spine.
IMPRESSION: Possible adynamic colonic ileus. No evidence of small bowel
obstruction.

## 2023-07-10 ENCOUNTER — Other Ambulatory Visit: Payer: Self-pay | Admitting: Family Medicine

## 2023-07-21 ENCOUNTER — Encounter: Payer: Self-pay | Admitting: Pharmacist

## 2023-07-27 ENCOUNTER — Encounter: Payer: Self-pay | Admitting: Family Medicine

## 2023-07-27 ENCOUNTER — Ambulatory Visit: Payer: BC Managed Care – PPO | Admitting: Family Medicine

## 2023-07-27 VITALS — BP 136/90 | HR 92 | Ht 76.0 in | Wt 300.0 lb

## 2023-07-27 DIAGNOSIS — E119 Type 2 diabetes mellitus without complications: Secondary | ICD-10-CM

## 2023-07-27 DIAGNOSIS — I10 Essential (primary) hypertension: Secondary | ICD-10-CM

## 2023-07-27 DIAGNOSIS — Z7985 Long-term (current) use of injectable non-insulin antidiabetic drugs: Secondary | ICD-10-CM | POA: Diagnosis not present

## 2023-07-27 DIAGNOSIS — Z1329 Encounter for screening for other suspected endocrine disorder: Secondary | ICD-10-CM

## 2023-07-27 DIAGNOSIS — K0889 Other specified disorders of teeth and supporting structures: Secondary | ICD-10-CM | POA: Insufficient documentation

## 2023-07-27 MED ORDER — ATORVASTATIN CALCIUM 40 MG PO TABS: 40.0000 mg | ORAL_TABLET | Freq: Every day | ORAL | 3 refills | Status: DC

## 2023-07-27 MED ORDER — AMOXICILLIN 500 MG PO TABS: 500.0000 mg | ORAL_TABLET | Freq: Two times a day (BID) | ORAL | 0 refills | Status: AC

## 2023-07-27 MED ORDER — DEXCOM G7 RECEIVER DEVI: 1 refills | Status: AC

## 2023-07-27 MED ORDER — GLIMEPIRIDE 2 MG PO TABS: 2.0000 mg | ORAL_TABLET | Freq: Every day | ORAL | 1 refills | Status: DC

## 2023-07-27 MED ORDER — TIRZEPATIDE 2.5 MG/0.5ML ~~LOC~~ SOAJ: 2.5000 mg | SUBCUTANEOUS | 0 refills | Status: DC

## 2023-07-27 MED ORDER — VITAMIN D3 50 MCG (2000 UT) PO CAPS: 2000.0000 [IU] | ORAL_CAPSULE | Freq: Every day | ORAL | 1 refills | Status: AC

## 2023-07-27 NOTE — Progress Notes (Signed)
Patient Office Visit   Subjective   Patient ID: Larry Moody, male    DOB: [birth year]  Age: 52 y.o. MRN: [id number]  CC:  Chief Complaint  Patient presents with   Care Management    6 week f/u, pt reports bp has been pretty good. Pt would ike to get dexcom rx.    Dental Pain    Pt reports tooth pain x 3 days.   Insomnia    Insomnia not any better.     HPI Larry Moody 52 year old male, presents to the clinic HTN follow up. He  has a past medical history of Diabetes mellitus without complication (HCC) and Hypertension.For the details of today's visit, please refer to assessment and plan.   HPI    Outpatient Encounter Medications as of 07/27/2023  Medication Sig   amoxicillin (AMOXIL) 500 MG tablet Take 1 tablet (500 mg total) by mouth 2 (two) times daily for 5 days.   Blood Glucose Monitoring Suppl DEVI 1 each by Does not apply route in the morning, at noon, and at bedtime. May substitute to any manufacturer covered by patient's insurance.   Continuous Glucose Receiver (DEXCOM G7 RECEIVER) DEVI Use as directed   Glucose Blood (BLOOD GLUCOSE TEST STRIPS) STRP 1 each by In Vitro route in the morning, at noon, and at bedtime. May substitute to any manufacturer covered by patient's insurance.   hydrOXYzine (VISTARIL) 25 MG capsule Take 1 capsule (25 mg total) by mouth every 8 (eight) hours as needed.   Lancet Device MISC 1 each by Does not apply route in the morning, at noon, and at bedtime. May substitute to any manufacturer covered by patient's insurance.   Lancets Misc. MISC 1 each by Does not apply route in the morning, at noon, and at bedtime. May substitute to any manufacturer covered by patient's insurance.   olmesartan (BENICAR) 20 MG tablet Take 0.5 tablets (10 mg total) by mouth daily.   tirzepatide [facility]) 2.5 MG/0.5ML Pen Inject 2.5 mg into the skin once a week.   [DISCONTINUED] atorvastatin (LIPITOR) 40 MG tablet Take 1 tablet (40 mg total) by mouth daily.    [DISCONTINUED] Cholecalciferol (VITAMIN D3) 50 MCG (2000 UT) capsule Take 1 capsule (2,000 Units total) by mouth daily.   [DISCONTINUED] glimepiride (AMARYL) 2 MG tablet Take 1 tablet (2 mg total) by mouth daily with breakfast.   [DISCONTINUED] OZEMPIC, 0.25 OR 0.5 MG/DOSE, 2 MG/3ML SOPN INJECT 0.5 MG INTO THE SKIN ONCE A WEEK   atorvastatin (LIPITOR) 40 MG tablet Take 1 tablet (40 mg total) by mouth daily.   Cholecalciferol (VITAMIN D3) 50 MCG (2000 UT) capsule Take 1 capsule (2,000 Units total) by mouth daily.   glimepiride (AMARYL) 2 MG tablet Take 1 tablet (2 mg total) by mouth daily with breakfast.   No facility-administered encounter medications on file as of 07/27/2023.    History reviewed. No pertinent surgical history.  Review of Systems  Constitutional:  Negative for fever.  Eyes:  Negative for blurred vision.  Respiratory:  Negative for shortness of breath.   Cardiovascular:  Negative for chest pain.  Gastrointestinal:  Positive for abdominal pain, diarrhea and nausea. Negative for blood in stool and melena.  Genitourinary:  Negative for dysuria.  Musculoskeletal:  Negative for myalgias.  Neurological:  Negative for dizziness and headaches.      Objective    BP (!) 136/90 (BP Location: Left Arm)   Pulse 92   Ht 6\' 4"  (1.93 m)  Wt (!) 300 lb 0.6 oz (136.1 kg)   SpO2 96%   BMI 36.52 kg/m   Physical Exam Vitals reviewed.  Constitutional:      General: He is not in acute distress.    Appearance: Normal appearance. He is not ill-appearing, toxic-appearing or diaphoretic.  HENT:     Head: Normocephalic.  Eyes:     General:        Right eye: No discharge.        Left eye: No discharge.     Conjunctiva/sclera: Conjunctivae normal.  Cardiovascular:     Rate and Rhythm: Normal rate.     Pulses: Normal pulses.     Heart sounds: Normal heart sounds.  Pulmonary:     Effort: Pulmonary effort is normal. No respiratory distress.     Breath sounds: Normal breath sounds.   Abdominal:     General: Bowel sounds are normal.     Palpations: Abdomen is soft.     Tenderness: There is no abdominal tenderness. There is no right CVA tenderness, left CVA tenderness or guarding.  Musculoskeletal:        General: Normal range of motion.     Cervical back: Normal range of motion.  Skin:    General: Skin is warm and dry.  Neurological:     Mental Status: He is alert.     Coordination: Coordination normal.     Gait: Gait normal.  Psychiatric:        Mood and Affect: Mood normal.       Assessment & Plan:  Screening for thyroid disorder -     TSH + free T4 -     Thyroid peroxidase antibody  Type 2 diabetes mellitus without complication, without long-term current use of insulin (HCC) Assessment & Plan: Stop taking Ozempic due to GI side effects Started Mounjaro 2.5 mg once a week injection Follow up in 8 weeks for labs  Orders: -     Glimepiride; Take 1 tablet (2 mg total) by mouth daily with breakfast.  Dispense: 90 tablet; Refill: 1  Essential hypertension Assessment & Plan: Vitals:   07/27/23 1011 07/27/23 1023  BP: (!) 145/95 (!) 136/90   Increased Olmesartan 20 mg once daily Follow up in 8 weeks Continued discussion on DASH diet, low sodium diet and maintain a exercise routine for 150 minutes per week.    Toothache Assessment & Plan: Amoxicillin 500 mg twice daily x 5 days Advise to follow up with his Dentist   Other orders -     Atorvastatin Calcium; Take 1 tablet (40 mg total) by mouth daily.  Dispense: 90 tablet; Refill: 3 -     Dexcom G7 Receiver; Use as directed  Dispense: 1 each; Refill: 1 -     Tirzepatide; Inject 2.5 mg into the skin once a week.  Dispense: 2 mL; Refill: 0 -     Vitamin D3; Take 1 capsule (2,000 Units total) by mouth daily.  Dispense: 90 capsule; Refill: 1 -     Amoxicillin; Take 1 tablet (500 mg total) by mouth 2 (two) times daily for 5 days.  Dispense: 10 tablet; Refill: 0    Return in about 8 weeks (around  09/21/2023), or if symptoms worsen or fail to improve, for type 2 diabetes, hypertension.   Cruzita Lederer Newman Nip, FNP

## 2023-07-27 NOTE — Assessment & Plan Note (Signed)
Vitals:   07/27/23 1011 07/27/23 1023  BP: (!) 145/95 (!) 136/90   Increased Olmesartan 20 mg once daily Follow up in 8 weeks Continued discussion on DASH diet, low sodium diet and maintain a exercise routine for 150 minutes per week.

## 2023-07-27 NOTE — Patient Instructions (Signed)

## 2023-07-27 NOTE — Assessment & Plan Note (Signed)
Amoxicillin 500 mg twice daily x 5 days Advise to follow up with his Dentist

## 2023-07-27 NOTE — Assessment & Plan Note (Signed)
Stop taking Ozempic due to GI side effects Started Mounjaro 2.5 mg once a week injection Follow up in 8 weeks for labs

## 2023-07-28 LAB — THYROID PEROXIDASE ANTIBODY: Thyroperoxidase Ab SerPl-aCnc: <9 IU/mL (ref 0–34)

## 2023-07-28 LAB — TSH+FREE T4
Free T4: 1.19 ng/dL (ref 0.82–1.77)
TSH: 3.34 u[IU]/mL (ref 0.450–4.500)

## 2023-08-25 ENCOUNTER — Encounter: Payer: Self-pay | Admitting: Family Medicine

## 2023-08-26 ENCOUNTER — Other Ambulatory Visit: Payer: Self-pay | Admitting: Family Medicine

## 2023-08-26 ENCOUNTER — Other Ambulatory Visit: Payer: Self-pay

## 2023-08-26 MED ORDER — TIRZEPATIDE 5 MG/0.5ML ~~LOC~~ SOAJ: 5.0000 mg | SUBCUTANEOUS | 0 refills | Status: DC

## 2023-08-26 MED ORDER — DEXCOM G7 SENSOR MISC: 5 refills | Status: DC

## 2023-09-20 NOTE — Patient Instructions (Signed)

## 2023-09-20 NOTE — Progress Notes (Unsigned)
   Established Patient Office Visit   Subjective  Patient ID: Larry Moody, male    DOB: [birth year]  Age: 52 y.o. MRN: [id number]  No chief complaint on file.   He  has a past medical history of Diabetes mellitus without complication (HCC) and Hypertension.  HPI  ROS    Objective:     There were no vitals taken for this visit. {Vitals History (Optional):23777}  Physical Exam   No results found for any visits on 09/21/23.  The 10-year ASCVD risk score (Arnett DK, et al., 2019) is: 26%    Assessment & Plan:  There are no diagnoses linked to this encounter.  No follow-ups on file.   Cruzita Lederer Newman Nip, FNP

## 2023-09-21 ENCOUNTER — Encounter: Payer: Self-pay | Admitting: Family Medicine

## 2023-09-21 ENCOUNTER — Ambulatory Visit: Payer: BC Managed Care – PPO | Admitting: Family Medicine

## 2023-09-21 VITALS — BP 146/88 | HR 87 | Ht 76.0 in | Wt 293.1 lb

## 2023-09-21 DIAGNOSIS — Z794 Long term (current) use of insulin: Secondary | ICD-10-CM

## 2023-09-21 DIAGNOSIS — I10 Essential (primary) hypertension: Secondary | ICD-10-CM

## 2023-09-21 DIAGNOSIS — E119 Type 2 diabetes mellitus without complications: Secondary | ICD-10-CM

## 2023-09-21 MED ORDER — TIRZEPATIDE 7.5 MG/0.5ML ~~LOC~~ SOAJ: 7.5000 mg | SUBCUTANEOUS | 0 refills | Status: DC

## 2023-09-21 MED ORDER — OLMESARTAN MEDOXOMIL 40 MG PO TABS: 40.0000 mg | ORAL_TABLET | Freq: Every day | ORAL | 2 refills | Status: DC

## 2023-09-21 MED ORDER — BLOOD GLUCOSE MONITORING SUPPL DEVI: 1.0000 | Freq: Three times a day (TID) | 0 refills | Status: AC

## 2023-09-21 NOTE — Assessment & Plan Note (Addendum)
Vitals:   09/21/23 0921 09/21/23 0929  BP: (!) 153/97 (!) 146/88  Increased Olmesartan 40 mg once daily Labs ordered. Discussed with  patient to monitor their blood pressure regularly and maintain a heart-healthy diet rich in fruits, vegetables, whole grains, and low-fat dairy, while reducing sodium intake to less than 2,300 mg per day. Regular physical activity, such as 30 minutes of moderate exercise most days of the week, will help lower blood pressure and improve overall cardiovascular health. Avoiding smoking, limiting alcohol consumption, and managing stress. Take  prescribed medication, & take it as directed and avoid skipping doses. Seek emergency care if your blood pressure is (over 180/100) or you experience chest pain, shortness of breath, or sudden vision changes.Patient verbalizes understanding regarding plan of care and all questions answered.

## 2023-09-21 NOTE — Assessment & Plan Note (Addendum)
Last Hemoglobin A1c: 14.6 Labs: Ordered today, results pending; will follow up accordingly. The patient reports adhering to prescribed medications: Glimperide 2 mg once daily and weekly Mounjaro injections   Reviewed non-pharmacological interventions, including a balanced diet rich in lean proteins, healthy fats, whole grains, and high-fiber vegetables. Emphasized reducing refined sugars and processed carbohydrates, and incorporating more fruits, leafy greens, and legumes. Education: Patient was educated on recognizing signs and symptoms of both hypoglycemia and hyperglycemia, and advised to seek emergency care if these symptoms occur. Follow-Up: Scheduled for follow-up in 3-4 months, or sooner if needed. Patient Understanding: The patient verbalized understanding of the care plan, and all questions were answered. Additional Care: Ophthalmology exam pending . Foot exam results were within normal limits.

## 2023-09-22 ENCOUNTER — Other Ambulatory Visit: Payer: Self-pay

## 2023-09-22 ENCOUNTER — Other Ambulatory Visit: Payer: Self-pay | Admitting: Family Medicine

## 2023-09-22 LAB — BMP8+EGFR
BUN/Creatinine Ratio: 12 (ref 9–20)
BUN: 10 mg/dL (ref 6–24)
CO2: 21 mmol/L (ref 20–29)
Calcium: 9.2 mg/dL (ref 8.7–10.2)
Chloride: 105 mmol/L (ref 96–106)
Creatinine, Ser: 0.81 mg/dL (ref 0.76–1.27)
Glucose: 108 mg/dL — ABNORMAL HIGH (ref 70–99)
Potassium: 3.8 mmol/L (ref 3.5–5.2)
Sodium: 142 mmol/L (ref 134–144)
eGFR: 105 mL/min/{1.73_m2} (ref >59)

## 2023-09-22 LAB — LIPID PANEL
Chol/HDL Ratio: 4.4 ratio (ref 0.0–5.0)
Cholesterol, Total: 136 mg/dL (ref 100–199)
HDL: 31 mg/dL — ABNORMAL LOW (ref >39)
LDL Chol Calc (NIH): 91 mg/dL (ref 0–99)
Triglycerides: 70 mg/dL (ref 0–149)
VLDL Cholesterol Cal: 14 mg/dL (ref 5–40)

## 2023-09-22 LAB — CBC WITH DIFFERENTIAL/PLATELET
Basophils Absolute: 0 10*3/uL (ref 0.0–0.2)
Basos: 0 %
EOS (ABSOLUTE): 0.2 10*3/uL (ref 0.0–0.4)
Eos: 3 %
Hematocrit: 41.7 % (ref 37.5–51.0)
Hemoglobin: 13.3 g/dL (ref 13.0–17.7)
Immature Grans (Abs): 0 10*3/uL (ref 0.0–0.1)
Immature Granulocytes: 0 %
Lymphocytes Absolute: 2.5 10*3/uL (ref 0.7–3.1)
Lymphs: 33 %
MCH: 28.4 pg (ref 26.6–33.0)
MCHC: 31.9 g/dL (ref 31.5–35.7)
MCV: 89 fL (ref 79–97)
Monocytes Absolute: 0.4 10*3/uL (ref 0.1–0.9)
Monocytes: 6 %
Neutrophils Absolute: 4.4 10*3/uL (ref 1.4–7.0)
Neutrophils: 58 %
Platelets: 293 10*3/uL (ref 150–450)
RBC: 4.68 x10E6/uL (ref 4.14–5.80)
RDW: 12.8 % (ref 11.6–15.4)
WBC: 7.5 10*3/uL (ref 3.4–10.8)

## 2023-09-22 LAB — HEMOGLOBIN A1C
Est. average glucose Bld gHb Est-mCnc: 140 mg/dL
Hgb A1c MFr Bld: 6.5 % — ABNORMAL HIGH (ref 4.8–5.6)

## 2023-09-22 MED ORDER — DEXCOM G7 SENSOR MISC: 5 refills | Status: DC

## 2023-09-22 MED ORDER — GLIPIZIDE 5 MG PO TABS: 2.5000 mg | ORAL_TABLET | Freq: Two times a day (BID) | ORAL | 3 refills | Status: DC

## 2023-09-22 NOTE — Telephone Encounter (Signed)
2 sensors last 1 month so he is probably just needing refills. I will send more in. Thanks

## 2023-11-08 ENCOUNTER — Other Ambulatory Visit: Payer: Self-pay | Admitting: Family Medicine

## 2023-11-08 MED ORDER — TIRZEPATIDE 10 MG/0.5ML ~~LOC~~ SOAJ: 10.0000 mg | SUBCUTANEOUS | 2 refills | Status: DC

## 2023-11-08 NOTE — Telephone Encounter (Signed)
 sent

## 2023-12-16 ENCOUNTER — Ambulatory Visit (HOSPITAL_BASED_OUTPATIENT_CLINIC_OR_DEPARTMENT_OTHER): Payer: BC Managed Care – PPO | Admitting: Internal Medicine

## 2023-12-16 ENCOUNTER — Other Ambulatory Visit: Payer: Self-pay | Admitting: Family Medicine

## 2023-12-16 ENCOUNTER — Encounter (HOSPITAL_BASED_OUTPATIENT_CLINIC_OR_DEPARTMENT_OTHER): Payer: Self-pay | Admitting: Internal Medicine

## 2023-12-16 VITALS — BP 160/84 | HR 96 | Ht 76.0 in | Wt 300.0 lb

## 2023-12-16 DIAGNOSIS — E66813 Obesity, class 3: Secondary | ICD-10-CM

## 2023-12-16 DIAGNOSIS — R9431 Abnormal electrocardiogram [ECG] [EKG]: Secondary | ICD-10-CM | POA: Diagnosis not present

## 2023-12-16 DIAGNOSIS — E785 Hyperlipidemia, unspecified: Secondary | ICD-10-CM

## 2023-12-16 DIAGNOSIS — R072 Precordial pain: Secondary | ICD-10-CM | POA: Diagnosis not present

## 2023-12-16 DIAGNOSIS — E119 Type 2 diabetes mellitus without complications: Secondary | ICD-10-CM

## 2023-12-16 MED ORDER — METOPROLOL TARTRATE 100 MG PO TABS
100.0000 mg | ORAL_TABLET | Freq: Once | ORAL | 0 refills | Status: DC
Start: 2023-12-16 — End: 2024-01-20

## 2023-12-16 NOTE — Progress Notes (Addendum)
LIPID CLINIC CONSULT NOTE  Chief Complaint:  High cholesterol, chest pain  Primary Care Physician: Rica Records, FNP  Primary Cardiologist:  None  HPI:  Larry Moody is a 53 y.o. male who is being seen today for the evaluation of high cholesterol and chest pain at the request of Del York Pellant*.  This a pleasant 53 year old male kindly referred for evaluation management of high cholesterol and chest pain.  He was referred recently after several cholesterol test showed total cholesterol in July 2024 of 286, triglycerides 224, HDL 33 and LDL 209.  At that time unfortunately his hemoglobin A1c was very high at 14.6%.  Subsequently he went on to significant dietary changes as well as additional medications and has lost additional weight down to 300 pounds today by our scale however he said he had about 10 pounds of additional clothing on.  This dramatic change in diet and lifestyle has led to further improvement in his lipid profile.  Labs in November 2024 showed total cholesterol now 136, triglycerides 70, HDL 31 and LDL 91.  He was also started on 40 mg of atorvastatin.  His hemoglobin A1c is improved to 6.5%.  He said he was a former smoker but has quit but does vape.  He has a history of heart disease in his family including his mother who had an MI and a stroke before he was born when she was in her 30s.  He is the youngest of I think 8 or 9 children.  In addition to the referral for lipid management, he tells me recently he has been having some left-sided chest discomfort.  This has been over the past week or so including left-sided chest pain that radiates into the left arm.  Is not necessarily with worse with exertion or relieved by rest but he sometimes feels it when laying down or doing certain activities.  He is also concerned because his best friend apparently died of a heart attack recently unexpectedly.  Based on his chest pain symptoms an EKG was performed in the  office today.  I personally reviewed that and it does show abnormality including ST and T wave changes inferiorly suggesting possible subendocardial ischemia compared with prior study in 2023.  Also of note, blood pressure was elevated today 160/84.  He did report being quite nervous for the appointment.  PMHx:  Past Medical History:  Diagnosis Date   Diabetes mellitus without complication (HCC)    Hyperlipidemia    Hypertension     No past surgical history on file.  FAMHx:  Family History  Problem Relation Age of Onset   Stroke Mother    Heart attack Mother    Hypertension Mother    Hypertension Father    Diabetes Brother     SOCHx:   reports that he quit smoking about 13 months ago. His smoking use included cigarettes. He has never used smokeless tobacco. He reports current alcohol use. He reports that he does not currently use drugs.  ALLERGIES:  Allergies  Allergen Reactions   Other Nausea And Vomiting    Green peas, liver    ROS: Pertinent items noted in HPI and remainder of comprehensive ROS otherwise negative.  HOME MEDS: Current Outpatient Medications on File Prior to Visit  Medication Sig Dispense Refill   Blood Glucose Monitoring Suppl DEVI 1 each by Does not apply route in the morning, at noon, and at bedtime. May substitute to any manufacturer covered by patient's  insurance. 1 each 0   Cholecalciferol (VITAMIN D3) 50 MCG (2000 UT) capsule Take 1 capsule (2,000 Units total) by mouth daily. 90 capsule 1   Continuous Glucose Receiver (DEXCOM G7 RECEIVER) DEVI Use as directed 1 each 1   Continuous Glucose Sensor (DEXCOM G7 SENSOR) MISC Use as directed to check blood sugar. DX e11.65 3 each 5   glipiZIDE (GLUCOTROL) 5 MG tablet Take 0.5 tablets (2.5 mg total) by mouth 2 (two) times daily before a meal. 90 tablet 3   hydrOXYzine (VISTARIL) 25 MG capsule Take 1 capsule (25 mg total) by mouth every 8 (eight) hours as needed. 30 capsule 3   Lancets (ONETOUCH DELICA PLUS  LANCET33G) MISC Apply 1 each topically 3 (three) times daily.     olmesartan (BENICAR) 40 MG tablet Take 1 tablet (40 mg total) by mouth daily. 90 tablet 2   tirzepatide (MOUNJARO) 10 MG/0.5ML Pen Inject 10 mg into the skin once a week. 2 mL 2   atorvastatin (LIPITOR) 40 MG tablet Take 1 tablet (40 mg total) by mouth daily. 90 tablet 3   No current facility-administered medications on file prior to visit.    LABS/IMAGING: No results found for this or any previous visit (from the past 48 hours). No results found.  LIPID PANEL:    Component Value Date/Time   CHOL 136 09/21/2023 1040   TRIG 70 09/21/2023 1040   HDL 31 (L) 09/21/2023 1040   CHOLHDL 4.4 09/21/2023 1040   LDLCALC 91 09/21/2023 1040    WEIGHTS: Wt Readings from Last 3 Encounters:  12/16/23 300 lb (136.1 kg)  09/21/23 293 lb 1.3 oz (132.9 kg)  07/27/23 (!) 300 lb 0.6 oz (136.1 kg)    VITALS: BP (!) 160/84   Pulse 96   Ht 6\' 4"  (1.93 m)   Wt 300 lb (136.1 kg)   SpO2 96%   BMI 36.52 kg/m   EXAM: General appearance: alert, no distress, and moderately obese Neck: no carotid bruit, no JVD, and thyroid not enlarged, symmetric, no tenderness/mass/nodules Lungs: clear to auscultation bilaterally Heart: regular rate and rhythm Abdomen: soft, non-tender; bowel sounds normal; no masses,  no organomegaly Extremities: extremities normal, atraumatic, no cyanosis or edema Pulses: 2+ and symmetric Skin: Skin color, texture, turgor normal. No rashes or lesions Neurologic: Grossly normal Psych: Pleasant  EKG: EKG Interpretation Date/Time:  Thursday December 16 2023 10:56:53 EST Ventricular Rate:  91 PR Interval:  108 QRS Duration:  86 QT Interval:  332 QTC Calculation: 408 R Axis:   7  Text Interpretation: Sinus rhythm with short PR Minimal voltage criteria for LVH, may be normal variant ( R in aVL ) Marked ST abnormality, possible inferior subendocardial injury When compared with ECG of 03-May-2022 14:57, Criteria  for Septal infarct are no longer Present ST more depressed Inferior leads Non-specific change in ST segment in Lateral leads T wave inversion now evident in Inferior leads Confirmed by Zoila Shutter [phone]) on 12/16/2023 11:02:34 AM    ASSESSMENT: Chest pain concerning for unstable angina Abnormal EKG with ischemic changes Dyslipidemia, goal LDL less than 70 Type 2 diabetes with recent A1c 14.6%-improved to 6.5% Former smoker-now vaping History obstructive sleep apnea with poor sleep desiring new equipment and recent weight loss - STOPBANG score of 8 Uncontrolled hypertension  PLAN: 1.   Mr. Blades was referred for lipid management but says that over the past week or so he has had some progressive left-sided chest pain and arm pain.  Not necessarily worse with  exertion or relieved by rest but he is more aware of it.  He also had a friend that died recently and was sudden heart attack.  I did get an EKG in the office today which is abnormal suggesting possible inferior subendocardial injury pattern.  We discussed how to best work this up including considering direct cardiac catheterization versus noninvasive evaluation such as coronary CT.  As he is not having active symptoms and his symptoms were not necessarily typical for angina, we decided on a noninvasive evaluation.  Will try to arrange coronary CT is soon as possible.  He was advised that if his symptoms worsen or fail to improve he should present to the emergency department for urgent evaluation.  For now would continue his atorvastatin.  Although his LDL is above target this was back in November and he has lost more weight and his cholesterol may be better improved.  He did indicate that he is having sleep difficulty and has a history of sleep apnea but old equipment and wants to have a new study to see if he can get some new equipment to use.  For that we will try to arrange for a home sleep study.  I will reach out to him with the results of  his testing and further evaluation as necessary.  Thanks again for the kind referral.  Chrystie Nose, MD, [facility]  [place]  [facility]  Medical Director of the Advanced Lipid Disorders &  Cardiovascular Risk Reduction Clinic Diplomate of the American Board of Clinical Lipidology Attending Cardiologist  Direct Dial: [phone]  Fax: [phone]  Website:  [email or web address] 12/16/2023, 12:53 PM

## 2023-12-16 NOTE — Patient Instructions (Addendum)
Medication Instructions:  Your physician has recommended you make the following change in your medication:   PLEASE TAKE METOPROLOL 100 MG 2 HOURS PRIOR TO CT   Testing/Procedures: Your physician recommends a coronary CT.  Your physician recommends a sleep study.   Follow-Up: At [facility], you and your health needs are our priority.  As part of our continuing mission to provide you with exceptional heart care, we have created designated Provider Care Teams.  These Care Teams include your primary Cardiologist (physician) and Advanced Practice Providers (APPs -  Physician Assistants and Nurse Practitioners) who all work together to provide you with the care you need, when you need it.  We recommend signing up for the patient portal called "MyChart".  Sign up information is provided on this After Visit Summary.  MyChart is used to connect with patients for Virtual Visits (Telemedicine).  Patients are able to view lab/test results, encounter notes, upcoming appointments, etc.  Non-urgent messages can be sent to your provider as well.   To learn more about what you can do with MyChart, go to [email or web address].    Your next appointment:   Follow up depending on coronary CT  Provider:   Dr. Rennis Golden  Other Instructions    Your cardiac CT will be scheduled at one of the below locations:   [facility] [address] [place], [place] [address] [phone]  If scheduled at [facility], please arrive at the [facility] and Children's Entrance (Entrance C2) of [facility] 30 minutes prior to test start time. You can use the FREE valet parking offered at entrance C (encouraged to control the heart rate for the test)  Proceed to the [facility] Radiology Department (first floor) to check-in and test prep.  All radiology patients and guests should use entrance C2 at [facility], accessed from [facility], even though the hospital's  physical address listed is [address].    If scheduled at [facility] or [facility], please arrive 15 mins early for check-in and test prep.  There is spacious parking and easy access to the radiology department from the [facility] V[facility] entrance. Please enter here and check-in with the desk attendant.   If scheduled at [facility], please arrive 30 minutes early for check-in and test prep.  Please follow these instructions carefully (unless otherwise directed):  An IV will be required for this test and Nitroglycerin will be given.  Hold all erectile dysfunction medications at least 3 days (72 hrs) prior to test. (Ie viagra, cialis, sildenafil, tadalafil, etc)   On the Night Before the Test: Be sure to Drink plenty of water. Do not consume any caffeinated/decaffeinated beverages or chocolate 12 hours prior to your test. Do not take any antihistamines 12 hours prior to your test.  On the Day of the Test: Drink plenty of water until 1 hour prior to the test. Do not eat any food 1 hour prior to test. You may take your regular medications prior to the test.  Take metoprolol (Lopressor) two hours prior to test. If you take Furosemide/Hydrochlorothiazide/Spironolactone/Chlorthalidone, please HOLD on the morning of the test. Patients who wear a continuous glucose monitor MUST remove the device prior to scanning.  After the Test: Drink plenty of water. After receiving IV contrast, you may experience a mild flushed feeling. This is normal. On occasion, you may experience a mild rash up to 24 hours after the  test. This is not dangerous. If this occurs, you can take Benadryl 25 mg, Zyrtec, Claritin, or Allegra and increase your fluid intake. (Patients taking Tikosyn should avoid Benadryl, and may take Zyrtec, Claritin, or Allegra) If you experience trouble breathing, this can be serious. If it is severe call 911  IMMEDIATELY. If it is mild, please call our office.  We will call to schedule your test 2-4 weeks out understanding that some insurance companies will need an authorization prior to the service being performed.   For more information and frequently asked questions, please visit our website : [email or web address]  For non-scheduling related questions, please contact the cardiac imaging nurse navigator should you have any questions/concerns: Cardiac Imaging Nurse Navigators Direct Office Dial: [phone]   For scheduling needs, including cancellations and rescheduling, please call [place], [phone].   WatchPAT?  Is a FDA cleared portable home sleep study test that uses a watch and 3 points of contact to monitor 7 different channels, including your heart rate, oxygen saturations, body position, snoring, and chest motion.  The study is easy to use from the comfort of your own home and accurately detect sleep apnea.  Before bed, you attach the chest sensor, attached the sleep apnea bracelet to your nondominant hand, and attach the finger probe.  After the study, the raw data is downloaded from the watch and scored for apnea events.   For more information: [email or web address]  Patient Testing Instructions:  Do not put battery into the device until bedtime when you are ready to begin the test. Please call the support number if you need assistance after following the instructions below: 24 hour support line- [phone] or [facility] support at [phone] (option 2)  Download the IntelWatchPAT One" app through the google play store or App Store  Be sure to turn on or enable access to bluetooth in settlings on your smartphone/ device  Make sure no other bluetooth devices are on and within the vicinity of your smartphone/ device and WatchPAT watch during testing.  Make sure to leave your smart phone/ device plugged in and charging all night.  When ready for  bed:  Follow the instructions step by step in the WatchPAT One App to activate the testing device. For additional instructions, including video instruction, visit the WatchPAT One video on Youtube. You can search for WatchPat One within Youtube (video is 4 minutes and 18 seconds) or enter: https://youtube/watch?[email or web address]_vbiwxE Please note: You will be prompted to enter a Pin to connect via bluetooth when starting the test. The PIN will be assigned to you when you receive the test.  The device is disposable, but it recommended that you retain the device until you receive a call letting you know the study has been received and the results have been interpreted.  We will let you know if the study did not transmit to [place] properly after the test is completed. You do not need to call us to confirm the receipt of the test.  Please complete the test within 48 hours of receiving PIN.   Frequently Asked Questions:  What is Watch Dennie Bible one?  A single use fully disposable home sleep apnea testing device and will not need to be returned after completion.  What are the requirements to use WatchPAT one?  The be able to have a successful watchpat one sleep study, you should have your Watch pat one device, your smart phone, watch pat one app, your PIN number and Internet access What type of phone do I  need?  You should have a smart phone that uses Android 5.1 and above or any Iphone with IOS 10 and above How can I download the WatchPAT one app?  Based on your device type search for WatchPAT one app either in google play for android devices or APP store for Iphone's Where will I get my PIN for the study?  Your PIN will be provided by your physician's office. It is used for authentication and if you lose/forget your PIN, please reach out to your providers office.  I do not have Internet at home. Can I do WatchPAT one study?  WatchPAT One needs Internet connection throughout the night to be able to transmit the sleep  data. You can use your home/local internet or your cellular's data package. However, it is always recommended to use home/local Internet. It is estimated that between 20MB-30MB will be used with each study.However, the application will be looking for space in the phone to start the study.  What happens if I lose internet or bluetooth connection?  During the internet disconnection, your phone will not be able to transmit the sleep data. All the data, will be stored in your phone. As soon as the internet connection is back on, the phone will being sending the sleep data. During the bluetooth disconnection, WatchPAT one will not be able to to send the sleep data to your phone. Data will be kept in the [facility] one until two devices have bluetooth connection back on. As soon as the connection is back on, WatchPAT one will send the sleep data to the phone.  How long do I need to wear the WatchPAT one?  After you start the study, you should wear the device at least 6 hours.  How far should I keep my phone from the device?  During the night, your phone should be within 15 feet.  What happens if I leave the room for restroom or other reasons?  Leaving the room for any reason will not cause any problem. As soon as your get back to the room, both devices will reconnect and will continue to send the sleep data. Can I use my phone during the sleep study?  Yes, you can use your phone as usual during the study. But it is recommended to put your watchpat one on when you are ready to go to bed.  How will I get my study results?  A soon as you completed your study, your sleep data will be sent to the provider. They will then share the results with you when they are ready.

## 2023-12-29 ENCOUNTER — Encounter (HOSPITAL_COMMUNITY): Payer: Self-pay

## 2023-12-30 ENCOUNTER — Telehealth (HOSPITAL_COMMUNITY): Payer: Self-pay | Admitting: *Deleted

## 2023-12-30 NOTE — Telephone Encounter (Signed)
Reaching out to patient to offer assistance regarding upcoming cardiac imaging study; pt verbalizes understanding of appt date/time, parking situation and where to check in, pre-test NPO status and medications ordered, and verified current allergies; name and call back number provided for further questions should they arise  Larey Brick RN Navigator Cardiac Imaging [facility] [phone] office [phone] cell  Patient to take 100mg  metoprolol tartrate two hours prior to his cardiac CT scan.  He is aware to arrive at 3 PM.

## 2023-12-31 ENCOUNTER — Ambulatory Visit (HOSPITAL_COMMUNITY)
Admission: RE | Admit: 2023-12-31 | Discharge: 2023-12-31 | Disposition: A | Discharge status: Home / Self Care | Payer: BC Managed Care – PPO | Source: Ambulatory Visit | Attending: Internal Medicine | Admitting: Internal Medicine

## 2023-12-31 DIAGNOSIS — R072 Precordial pain: Secondary | ICD-10-CM | POA: Diagnosis not present

## 2023-12-31 MED ORDER — DILTIAZEM HCL 25 MG/5ML IV SOLN
10.0000 mg | INTRAVENOUS | Status: DC | PRN
  Administered 2023-12-31: 10 mg via INTRAVENOUS

## 2023-12-31 MED ORDER — METOPROLOL TARTRATE 5 MG/5ML IV SOLN
10.0000 mg | Freq: Once | INTRAVENOUS | Status: AC | PRN
  Administered 2023-12-31: 10 mg via INTRAVENOUS

## 2023-12-31 MED ORDER — NITROGLYCERIN 0.4 MG SL SUBL
0.8000 mg | SUBLINGUAL_TABLET | Freq: Once | SUBLINGUAL | Status: AC
  Administered 2023-12-31: 0.8 mg via SUBLINGUAL

## 2023-12-31 MED ORDER — DILTIAZEM HCL 25 MG/5ML IV SOLN
INTRAVENOUS | Status: AC
  Filled 2023-12-31: qty 5

## 2023-12-31 MED ORDER — METOPROLOL TARTRATE 5 MG/5ML IV SOLN
INTRAVENOUS | Status: AC
  Filled 2023-12-31: qty 15

## 2023-12-31 MED ORDER — NITROGLYCERIN 0.4 MG SL SUBL
SUBLINGUAL_TABLET | SUBLINGUAL | Status: AC
  Filled 2023-12-31: qty 2

## 2023-12-31 MED ORDER — IOHEXOL 350 MG/ML SOLN
95.0000 mL | Freq: Once | INTRAVENOUS | Status: AC | PRN
  Administered 2023-12-31: 95 mL via INTRAVENOUS

## 2023-12-31 MED ORDER — DILTIAZEM HCL 25 MG/5ML IV SOLN
20.0000 mg | INTRAVENOUS | Status: AC | PRN
  Administered 2023-12-31: 10 mg via INTRAVENOUS

## 2024-01-03 ENCOUNTER — Encounter: Payer: Self-pay | Admitting: Family Medicine

## 2024-01-04 ENCOUNTER — Encounter: Payer: Self-pay | Admitting: *Deleted

## 2024-01-05 LAB — POCT I-STAT CREATININE: Creatinine, Ser: 0.8 mg/dL (ref 0.61–1.24)

## 2024-01-07 ENCOUNTER — Other Ambulatory Visit (HOSPITAL_COMMUNITY): Payer: Self-pay | Admitting: *Deleted

## 2024-01-07 DIAGNOSIS — E785 Hyperlipidemia, unspecified: Secondary | ICD-10-CM

## 2024-01-20 ENCOUNTER — Ambulatory Visit: Payer: BC Managed Care – PPO | Admitting: Family Medicine

## 2024-01-20 ENCOUNTER — Telehealth: Payer: Self-pay | Admitting: Family Medicine

## 2024-01-20 ENCOUNTER — Encounter: Payer: Self-pay | Admitting: Family Medicine

## 2024-01-20 VITALS — BP 132/88 | HR 90 | Resp 16 | Ht 76.0 in | Wt 298.8 lb

## 2024-01-20 DIAGNOSIS — E559 Vitamin D deficiency, unspecified: Secondary | ICD-10-CM | POA: Diagnosis not present

## 2024-01-20 DIAGNOSIS — I10 Essential (primary) hypertension: Secondary | ICD-10-CM

## 2024-01-20 DIAGNOSIS — Z7984 Long term (current) use of oral hypoglycemic drugs: Secondary | ICD-10-CM

## 2024-01-20 DIAGNOSIS — E119 Type 2 diabetes mellitus without complications: Secondary | ICD-10-CM

## 2024-01-20 DIAGNOSIS — R252 Cramp and spasm: Secondary | ICD-10-CM | POA: Diagnosis not present

## 2024-01-20 DIAGNOSIS — D509 Iron deficiency anemia, unspecified: Secondary | ICD-10-CM

## 2024-01-20 DIAGNOSIS — Z7985 Long-term (current) use of injectable non-insulin antidiabetic drugs: Secondary | ICD-10-CM

## 2024-01-20 MED ORDER — DEXCOM G7 SENSOR MISC: 5 refills | Status: DC

## 2024-01-20 MED ORDER — TIRZEPATIDE 10 MG/0.5ML ~~LOC~~ SOAJ: 10.0000 mg | SUBCUTANEOUS | 2 refills | Status: DC

## 2024-01-20 NOTE — Telephone Encounter (Signed)
 Prescription Request  01/20/2024  LOV: 01/20/2024  What is the name of the medication or equipment? tirzepatide [facility]) 10 MG/0.5ML Pen [[id number]]    Have you contacted your pharmacy to request a refill? Yes   Which pharmacy would you like this sent to?  [id number] - [place], [place] - [address] AT [facility] [address] [place] [address] Phone: [phone] Fax: [phone]    Patient notified that their request is being sent to the clinical staff for review and that they should receive a response within 2 business days.   Please advise at Mobile [phone] (mobile)

## 2024-01-20 NOTE — Assessment & Plan Note (Signed)
 Vitals:   01/20/24 0942  BP: 132/88   Controlled, Olmesartan 40 mg once daily  Labs ordered. Discussed with  patient to monitor their blood pressure regularly and maintain a heart-healthy diet rich in fruits, vegetables, whole grains, and low-fat dairy, while reducing sodium intake to less than 2,300 mg per day. Regular physical activity, such as 30 minutes of moderate exercise most days of the week, will help lower blood pressure and improve overall cardiovascular health. Avoiding smoking, limiting alcohol consumption, and managing stress. Take  prescribed medication, & take it as directed and avoid skipping doses. Seek emergency care if your blood pressure is (over 180/100) or you experience chest pain, shortness of breath, or sudden vision changes.Patient verbalizes understanding regarding plan of care and all questions answered.

## 2024-01-20 NOTE — Telephone Encounter (Signed)
 sent

## 2024-01-20 NOTE — Patient Instructions (Signed)

## 2024-01-20 NOTE — Assessment & Plan Note (Signed)
 Last Hemoglobin A1c: 6.5 Labs: Ordered today, results pending; will follow up accordingly. The patient reports adhering to prescribed medications: Mounjar[place]ekly injections and Glipizide 5 mg once daily  Reviewed non-pharmacological interventions, including a balanced diet rich in lean proteins, healthy fats, whole grains, and high-fiber vegetables. Emphasized reducing refined sugars and processed carbohydrates, and incorporating more fruits, leafy greens, and legumes. Education: Patient was educated on recognizing signs and symptoms of both hypoglycemia and hyperglycemia, and advised to seek emergency care if these symptoms occur. Follow-Up: Scheduled for follow-up in 3-4 months, or sooner if needed. Patient Understanding: The patient verbalized understanding of the care plan, and all questions were answered. Additional Care: Ophthalmology referral was placed. Foot exam results were within normal limits.

## 2024-01-20 NOTE — Progress Notes (Signed)
 Established Patient Office Visit   Subjective  Patient ID: Larry Moody, male    DOB: 1971-10-23  Age: 53 y.o. MRN: [id number]  Chief Complaint  Patient presents with   Hypertension    Follow up visit    Diabetes    He  has a past medical history of Diabetes mellitus without complication (HCC), Hyperlipidemia, and Hypertension.  Diabetes He presents for his follow-up diabetic visit. He has type 2 diabetes mellitus. Hypoglycemia symptoms include headaches and tremors. Pertinent negatives for diabetes include no chest pain, no foot ulcerations and no weakness. Pertinent negatives for hypoglycemia complications include no blackouts. Symptoms are improving. Pertinent negatives for diabetic complications include no peripheral neuropathy. Risk factors for coronary artery disease include hypertension, dyslipidemia, obesity and male sex. Current diabetic treatments: Mounjaro weekly injections and Glipizde 5 mg once daily. He is compliant with treatment all of the time. His weight is fluctuating minimally. He is following a diabetic diet. Meal planning includes avoidance of concentrated sweets. He has not had a previous visit with a dietitian. He participates in exercise intermittently. His breakfast blood glucose range is generally 90-110 mg/dl. His lunch blood glucose range is generally 110-130 mg/dl. His bedtime blood glucose range is generally 130-140 mg/dl. An ACE inhibitor/angiotensin II receptor blocker is being taken. He does not see a podiatrist.Eye exam is current.    Review of Systems  Constitutional:  Negative for chills and fever.  Respiratory:  Negative for shortness of breath.   Cardiovascular:  Negative for chest pain.  Neurological:  Positive for tremors and headaches. Negative for weakness.      Objective:     BP 132/88   Pulse 90   Resp 16   Ht 6\' 4"  (1.93 m)   Wt 298 lb 12.8 oz (135.5 kg)   SpO2 96%   BMI 36.37 kg/m  BP Readings from Last 3 Encounters:  01/20/24  132/88  12/31/23 139/77  12/16/23 (!) 160/84      Physical Exam Vitals reviewed.  Constitutional:      General: He is not in acute distress.    Appearance: Normal appearance. He is not ill-appearing, toxic-appearing or diaphoretic.  HENT:     Head: Normocephalic.  Eyes:     General:        Right eye: No discharge.        Left eye: No discharge.     Conjunctiva/sclera: Conjunctivae normal.  Cardiovascular:     Rate and Rhythm: Normal rate.     Pulses: Normal pulses.     Heart sounds: Normal heart sounds.  Pulmonary:     Effort: Pulmonary effort is normal. No respiratory distress.     Breath sounds: Normal breath sounds.  Abdominal:     General: Bowel sounds are normal.     Palpations: Abdomen is soft.     Tenderness: There is no abdominal tenderness. There is no right CVA tenderness, left CVA tenderness or guarding.  Musculoskeletal:     Lumbar back: No bony tenderness. Normal range of motion. Positive right straight leg raise test. Negative left straight leg raise test.  Skin:    General: Skin is warm and dry.     Capillary Refill: Capillary refill takes less than 2 seconds.  Neurological:     Mental Status: He is alert.     Coordination: Coordination normal.     Gait: Gait normal.  Psychiatric:        Mood and Affect: Mood normal.  Behavior: Behavior normal.      No results found for any visits on 01/20/24.  The 10-year ASCVD risk score (Arnett DK, et al., 2019) is: 20.6%    Assessment & Plan:  Primary hypertension -     Lipid panel -     BMP8+eGFR -     CBC with Differential/Platelet  Vitamin D deficiency -     VITAMIN D 25 Hydroxy (Vit-D Deficiency, Fractures)  Type 2 diabetes mellitus without complication, without long-term current use of insulin (HCC) Assessment & Plan: Last Hemoglobin A1c: 6.5 Labs: Ordered today, results pending; will follow up accordingly. The patient reports adhering to prescribed medications: Mounjaroweekly injections and  Glipizide 5 mg once daily  Reviewed non-pharmacological interventions, including a balanced diet rich in lean proteins, healthy fats, whole grains, and high-fiber vegetables. Emphasized reducing refined sugars and processed carbohydrates, and incorporating more fruits, leafy greens, and legumes. Education: Patient was educated on recognizing signs and symptoms of both hypoglycemia and hyperglycemia, and advised to seek emergency care if these symptoms occur. Follow-Up: Scheduled for follow-up in 3-4 months, or sooner if needed. Patient Understanding: The patient verbalized understanding of the care plan, and all questions were answered. Additional Care: Ophthalmology referral was placed. Foot exam results were within normal limits.   Orders: -     Hemoglobin A1c -     Microalbumin / creatinine urine ratio -     Ambulatory referral to Ophthalmology  Muscle cramp -     Magnesium  Iron deficiency anemia, unspecified iron deficiency anemia type -     Iron, TIBC and Ferritin Panel -     Vitamin B12  Other orders -     Dexcom G7 Sensor; Use as directed to check blood sugar. DX e11.65  Dispense: 3 each; Refill: 5    Return in about 4 months (around 05/21/2024), or if symptoms worsen or fail to improve, for type 2 diabetes, hypertension, hyperlipidemia.   Cruzita Lederer Newman Nip, FNP

## 2024-01-21 ENCOUNTER — Encounter: Payer: Self-pay | Admitting: Family Medicine

## 2024-01-22 LAB — CBC WITH DIFFERENTIAL/PLATELET
Basophils Absolute: 0 10*3/uL (ref 0.0–0.2)
Basos: 0 %
EOS (ABSOLUTE): 0.5 10*3/uL — ABNORMAL HIGH (ref 0.0–0.4)
Eos: 6 %
Hematocrit: 41.3 % (ref 37.5–51.0)
Hemoglobin: 13.5 g/dL (ref 13.0–17.7)
Immature Grans (Abs): 0 10*3/uL (ref 0.0–0.1)
Immature Granulocytes: 0 %
Lymphocytes Absolute: 2.5 10*3/uL (ref 0.7–3.1)
Lymphs: 33 %
MCH: 28.5 pg (ref 26.6–33.0)
MCHC: 32.7 g/dL (ref 31.5–35.7)
MCV: 87 fL (ref 79–97)
Monocytes Absolute: 0.4 10*3/uL (ref 0.1–0.9)
Monocytes: 6 %
Neutrophils Absolute: 4 10*3/uL (ref 1.4–7.0)
Neutrophils: 55 %
Platelets: 268 10*3/uL (ref 150–450)
RBC: 4.74 x10E6/uL (ref 4.14–5.80)
RDW: 12.7 % (ref 11.6–15.4)
WBC: 7.4 10*3/uL (ref 3.4–10.8)

## 2024-01-22 LAB — IRON,TIBC AND FERRITIN PANEL
Ferritin: 294 ng/mL (ref 30–400)
Iron Saturation: 18 % (ref 15–55)
Iron: 53 ug/dL (ref 38–169)
Total Iron Binding Capacity: 292 ug/dL (ref 250–450)
UIBC: 239 ug/dL (ref 111–343)

## 2024-01-22 LAB — BMP8+EGFR
BUN/Creatinine Ratio: 12 (ref 9–20)
BUN: 11 mg/dL (ref 6–24)
CO2: 24 mmol/L (ref 20–29)
Calcium: 9.2 mg/dL (ref 8.7–10.2)
Chloride: 103 mmol/L (ref 96–106)
Creatinine, Ser: 0.93 mg/dL (ref 0.76–1.27)
Glucose: 103 mg/dL — ABNORMAL HIGH (ref 70–99)
Potassium: 4.2 mmol/L (ref 3.5–5.2)
Sodium: 141 mmol/L (ref 134–144)
eGFR: 98 mL/min/{1.73_m2} (ref >59)

## 2024-01-22 LAB — MICROALBUMIN / CREATININE URINE RATIO
Creatinine, Urine: 247.2 mg/dL
Microalb/Creat Ratio: 8 mg/g{creat} (ref 0–29)
Microalbumin, Urine: 19.7 ug/mL

## 2024-01-22 LAB — MAGNESIUM: Magnesium: 2 mg/dL (ref 1.6–2.3)

## 2024-01-22 LAB — LIPID PANEL
Chol/HDL Ratio: 4.1 ratio (ref 0.0–5.0)
Cholesterol, Total: 147 mg/dL (ref 100–199)
HDL: 36 mg/dL — ABNORMAL LOW (ref >39)
LDL Chol Calc (NIH): 99 mg/dL (ref 0–99)
Triglycerides: 55 mg/dL (ref 0–149)
VLDL Cholesterol Cal: 12 mg/dL (ref 5–40)

## 2024-01-22 LAB — VITAMIN B12: Vitamin B-12: 612 pg/mL (ref 232–1245)

## 2024-01-22 LAB — VITAMIN D 25 HYDROXY (VIT D DEFICIENCY, FRACTURES): Vit D, 25-Hydroxy: 22.6 ng/mL — ABNORMAL LOW (ref 30.0–100.0)

## 2024-01-22 LAB — HEMOGLOBIN A1C
Est. average glucose Bld gHb Est-mCnc: 128 mg/dL
Hgb A1c MFr Bld: 6.1 % — ABNORMAL HIGH (ref 4.8–5.6)

## 2024-03-03 ENCOUNTER — Telehealth: Payer: Self-pay

## 2024-03-03 NOTE — Telephone Encounter (Signed)
**Note De-identified Sandro Burgo Obfuscation**  **Note De-Identified Latina Frank Obfuscation** Ordering provider: Dr Maximo Spar Associated diagnoses: Insomnia-G47.00 and Precordial pain-R07.2   WatchPAT PA obtained on 03/03/2024 by Idara Woodside, Isabella Mao, LPN. Authorization: Per the [employer] provider portal: Order ID: [id number] Completed Approval Valid Through:03/03/2024 - 05/01/2024  Patient notified of PIN (1234) on 03/03/2024 Carnelia Oscar Notification Method: phone. No answer so I left a message on the pts VM (Ok per [facility]) advising him of the [facility] One-HST Device Pin # of "1234". I did leave my name and the office phone number in the message so he can call me back if he has any questions. I also sent the pt a MYCHART message advising him of the WatchPAT One-HST Pin #.  Phone note routed to covering staff for follow-up.

## 2024-03-09 ENCOUNTER — Other Ambulatory Visit: Payer: Self-pay | Admitting: Family Medicine

## 2024-03-10 MED ORDER — ATORVASTATIN CALCIUM 40 MG PO TABS: 40.0000 mg | ORAL_TABLET | Freq: Every day | ORAL | 3 refills | Status: DC

## 2024-03-13 ENCOUNTER — Other Ambulatory Visit: Payer: Self-pay

## 2024-05-09 DIAGNOSIS — H401131 Primary open-angle glaucoma, bilateral, mild stage: Secondary | ICD-10-CM | POA: Diagnosis not present

## 2024-05-26 ENCOUNTER — Encounter: Payer: Self-pay | Admitting: Family Medicine

## 2024-05-26 ENCOUNTER — Ambulatory Visit: Admitting: Family Medicine

## 2024-05-26 VITALS — BP 137/86 | HR 89 | Ht 76.0 in | Wt 323.0 lb

## 2024-05-26 DIAGNOSIS — E038 Other specified hypothyroidism: Secondary | ICD-10-CM

## 2024-05-26 DIAGNOSIS — I10 Essential (primary) hypertension: Secondary | ICD-10-CM | POA: Diagnosis not present

## 2024-05-26 DIAGNOSIS — E782 Mixed hyperlipidemia: Secondary | ICD-10-CM | POA: Diagnosis not present

## 2024-05-26 DIAGNOSIS — E119 Type 2 diabetes mellitus without complications: Secondary | ICD-10-CM | POA: Diagnosis not present

## 2024-05-26 DIAGNOSIS — Z7984 Long term (current) use of oral hypoglycemic drugs: Secondary | ICD-10-CM

## 2024-05-26 DIAGNOSIS — M542 Cervicalgia: Secondary | ICD-10-CM

## 2024-05-26 MED ORDER — NICOTINE 7 MG/24HR TD PT24: 7.0000 mg | MEDICATED_PATCH | Freq: Every day | TRANSDERMAL | 0 refills | Status: AC

## 2024-05-26 NOTE — Assessment & Plan Note (Signed)
 Continue Olmesartan  40 mg once daily Labs ordered Continued discussion on DASH diet, low sodium diet and maintain a exercise routine for 150 minutes per week.

## 2024-05-26 NOTE — Patient Instructions (Signed)

## 2024-05-26 NOTE — Progress Notes (Signed)
 Established Patient Office Visit   Subjective  Patient ID: Larry Moody, male    DOB: 12-22-1970  Age: 53 y.o. MRN: [id number]  Chief Complaint  Patient presents with   Hypertension    Four month follow up   Diabetes    Four month follow up    Arm Pain    Off and on arm pain     He  has a past medical history of Diabetes mellitus without complication (HCC), Hyperlipidemia, and Hypertension.  HPI Patient presents to the clinic for  chronic follow up. For the details of today's visit, please refer to assessment and plan.  Review of Systems  Constitutional:  Negative for chills and fever.  Respiratory:  Negative for shortness of breath.   Cardiovascular:  Negative for chest pain.  Musculoskeletal:  Positive for myalgias.       Left arm pain      Objective:     BP 137/86   Pulse 89   Ht 6' 4 (1.93 m)   Wt (!) 323 lb (146.5 kg)   SpO2 98%   BMI 39.32 kg/m  BP Readings from Last 3 Encounters:  05/26/24 137/86  01/20/24 132/88  12/31/23 139/77      Physical Exam Vitals reviewed.  Constitutional:      General: He is not in acute distress.    Appearance: Normal appearance. He is not ill-appearing, toxic-appearing or diaphoretic.  HENT:     Head: Normocephalic.  Eyes:     General:        Right eye: No discharge.        Left eye: No discharge.     Conjunctiva/sclera: Conjunctivae normal.  Cardiovascular:     Rate and Rhythm: Normal rate.     Pulses: Normal pulses.     Heart sounds: Normal heart sounds.  Pulmonary:     Effort: Pulmonary effort is normal. No respiratory distress.     Breath sounds: Normal breath sounds.  Skin:    General: Skin is warm and dry.  Neurological:     Mental Status: He is alert.     Gait: Gait normal.  Psychiatric:        Mood and Affect: Mood normal.        Behavior: Behavior normal.      No results found for any visits on 05/26/24.  The 10-year ASCVD risk score (Arnett DK, et al., 2019) is: 21.6%    Assessment &  Plan:  Type 2 diabetes mellitus without complication, without long-term current use of insulin  (HCC) Assessment & Plan: Last Hemoglobin A1c: 6.1 Labs: Ordered today, results pending; will follow up accordingly. The patient reports adhering to prescribed medications: Glipizide  5 mg twice daily and mounjaro  weekly injections  Reviewed non-pharmacological interventions, including a balanced diet rich in lean proteins, healthy fats, whole grains, and high-fiber vegetables. Emphasized reducing refined sugars and processed carbohydrates, and incorporating more fruits, leafy greens, and legumes. Education: Patient was educated on recognizing signs and symptoms of both hypoglycemia and hyperglycemia, and advised to seek emergency care if these symptoms occur. Follow-Up: Scheduled for follow-up in 3-4 months, or sooner if needed. Patient Understanding: The patient verbalized understanding of the care plan, and all questions were answered. Additional Care: Ophthalmology referral was placed. Foot exam results were within normal limits.   Orders: -     Hemoglobin A1c -     Ambulatory referral to Ophthalmology  TSH (thyroid -stimulating hormone deficiency) -     TSH + free  T4  Mixed hyperlipidemia  Primary hypertension -     BMP8+eGFR -     CBC with Differential/Platelet -     Lipid panel  Neck pain -     DG Cervical Spine Complete; Future  Essential hypertension Assessment & Plan: Continue Olmesartan  40 mg once daily Labs ordered Continued discussion on DASH diet, low sodium diet and maintain a exercise routine for 150 minutes per week.    Other orders -     Nicotine; Place 1 patch (7 mg total) onto the skin daily.  Dispense: 28 patch; Refill: 0    Return in about 4 months (around 09/26/2024), or if symptoms worsen or fail to improve, for hypertension, type 2 diabetes.   Hilario Kidd Wilhelmena Falter, FNP

## 2024-05-26 NOTE — Assessment & Plan Note (Signed)
 Last Hemoglobin A1c: 6.1 Labs: Ordered today, results pending; will follow up accordingly. The patient reports adhering to prescribed medications: Glipizide  5 mg twice daily and mounjaro  weekly injections  Reviewed non-pharmacological interventions, including a balanced diet rich in lean proteins, healthy fats, whole grains, and high-fiber vegetables. Emphasized reducing refined sugars and processed carbohydrates, and incorporating more fruits, leafy greens, and legumes. Education: Patient was educated on recognizing signs and symptoms of both hypoglycemia and hyperglycemia, and advised to seek emergency care if these symptoms occur. Follow-Up: Scheduled for follow-up in 3-4 months, or sooner if needed. Patient Understanding: The patient verbalized understanding of the care plan, and all questions were answered. Additional Care: Ophthalmology referral was placed. Foot exam results were within normal limits.

## 2024-05-27 LAB — CBC WITH DIFFERENTIAL/PLATELET
Basophils Absolute: 0 x10E3/uL (ref 0.0–0.2)
Basos: 0 %
EOS (ABSOLUTE): 0.1 x10E3/uL (ref 0.0–0.4)
Eos: 1 %
Hematocrit: 40.8 % (ref 37.5–51.0)
Hemoglobin: 11.9 g/dL — ABNORMAL LOW (ref 13.0–17.7)
Immature Grans (Abs): 0 x10E3/uL (ref 0.0–0.1)
Immature Granulocytes: 0 %
Lymphocytes Absolute: 3.2 x10E3/uL — ABNORMAL HIGH (ref 0.7–3.1)
Lymphs: 37 %
MCH: 25.2 pg — ABNORMAL LOW (ref 26.6–33.0)
MCHC: 29.2 g/dL — ABNORMAL LOW (ref 31.5–35.7)
MCV: 86 fL (ref 79–97)
Monocytes Absolute: 0.8 x10E3/uL (ref 0.1–0.9)
Monocytes: 9 %
Neutrophils Absolute: 4.5 x10E3/uL (ref 1.4–7.0)
Neutrophils: 53 %
Platelets: 399 x10E3/uL (ref 150–450)
RBC: 4.73 x10E6/uL (ref 4.14–5.80)
RDW: 14.1 % (ref 11.6–15.4)
WBC: 8.6 x10E3/uL (ref 3.4–10.8)

## 2024-05-27 LAB — BMP8+EGFR
BUN/Creatinine Ratio: 11 (ref 9–20)
BUN: 12 mg/dL (ref 6–24)
CO2: 25 mmol/L (ref 20–29)
Calcium: 9.2 mg/dL (ref 8.7–10.2)
Chloride: 102 mmol/L (ref 96–106)
Creatinine, Ser: 1.05 mg/dL (ref 0.76–1.27)
Glucose: 94 mg/dL (ref 70–99)
Potassium: 4.3 mmol/L (ref 3.5–5.2)
Sodium: 139 mmol/L (ref 134–144)
eGFR: 84 mL/min/1.73 (ref >59)

## 2024-05-27 LAB — TSH+FREE T4
Free T4: 1.04 ng/dL (ref 0.82–1.77)
TSH: 1.23 u[IU]/mL (ref 0.450–4.500)

## 2024-05-27 LAB — HEMOGLOBIN A1C
Est. average glucose Bld gHb Est-mCnc: 117 mg/dL
Hgb A1c MFr Bld: 5.7 % — ABNORMAL HIGH (ref 4.8–5.6)

## 2024-05-27 LAB — LIPID PANEL
Chol/HDL Ratio: 4 ratio (ref 0.0–5.0)
Cholesterol, Total: 165 mg/dL (ref 100–199)
HDL: 41 mg/dL (ref >39)
LDL Chol Calc (NIH): 102 mg/dL — ABNORMAL HIGH (ref 0–99)
Triglycerides: 121 mg/dL (ref 0–149)
VLDL Cholesterol Cal: 22 mg/dL (ref 5–40)

## 2024-05-31 ENCOUNTER — Ambulatory Visit: Payer: Self-pay | Admitting: Family Medicine

## 2024-05-31 ENCOUNTER — Other Ambulatory Visit: Payer: Self-pay | Admitting: Family Medicine

## 2024-05-31 MED ORDER — ATORVASTATIN CALCIUM 40 MG PO TABS: 40.0000 mg | ORAL_TABLET | Freq: Every day | ORAL | 3 refills | Status: DC

## 2024-05-31 MED ORDER — TIRZEPATIDE 10 MG/0.5ML ~~LOC~~ SOAJ: 10.0000 mg | SUBCUTANEOUS | 2 refills | Status: DC

## 2024-05-31 MED ORDER — GLIPIZIDE 5 MG PO TABS: 2.5000 mg | ORAL_TABLET | Freq: Two times a day (BID) | ORAL | 3 refills | Status: DC

## 2024-05-31 MED ORDER — ATORVASTATIN CALCIUM 10 MG PO TABS: 10.0000 mg | ORAL_TABLET | Freq: Every day | ORAL | 3 refills | Status: AC

## 2024-06-06 DIAGNOSIS — H401113 Primary open-angle glaucoma, right eye, severe stage: Secondary | ICD-10-CM | POA: Diagnosis not present

## 2024-07-03 ENCOUNTER — Other Ambulatory Visit: Payer: Self-pay | Admitting: Family Medicine

## 2024-07-12 ENCOUNTER — Emergency Department (HOSPITAL_COMMUNITY)
Admission: EM | Admit: 2024-07-12 | Discharge: 2024-07-13 | Disposition: A | Discharge status: Home / Self Care | Attending: Emergency Medicine | Admitting: Emergency Medicine

## 2024-07-12 ENCOUNTER — Encounter (HOSPITAL_COMMUNITY): Payer: Self-pay

## 2024-07-12 ENCOUNTER — Emergency Department (HOSPITAL_COMMUNITY)

## 2024-07-12 ENCOUNTER — Other Ambulatory Visit: Payer: Self-pay

## 2024-07-12 DIAGNOSIS — I10 Essential (primary) hypertension: Secondary | ICD-10-CM | POA: Diagnosis not present

## 2024-07-12 DIAGNOSIS — R0602 Shortness of breath: Secondary | ICD-10-CM | POA: Insufficient documentation

## 2024-07-12 DIAGNOSIS — R053 Chronic cough: Secondary | ICD-10-CM | POA: Diagnosis not present

## 2024-07-12 DIAGNOSIS — R059 Cough, unspecified: Secondary | ICD-10-CM | POA: Diagnosis not present

## 2024-07-12 DIAGNOSIS — Z7984 Long term (current) use of oral hypoglycemic drugs: Secondary | ICD-10-CM | POA: Insufficient documentation

## 2024-07-12 DIAGNOSIS — J40 Bronchitis, not specified as acute or chronic: Secondary | ICD-10-CM | POA: Diagnosis not present

## 2024-07-12 DIAGNOSIS — E119 Type 2 diabetes mellitus without complications: Secondary | ICD-10-CM | POA: Diagnosis not present

## 2024-07-12 DIAGNOSIS — Z79899 Other long term (current) drug therapy: Secondary | ICD-10-CM | POA: Insufficient documentation

## 2024-07-12 LAB — CBC WITH DIFFERENTIAL/PLATELET
Abs Immature Granulocytes: 0.01 K/uL (ref 0.00–0.07)
Basophils Absolute: 0 K/uL (ref 0.0–0.1)
Basophils Relative: 0 %
Eosinophils Absolute: 0.2 K/uL (ref 0.0–0.5)
Eosinophils Relative: 2 %
HCT: 38.7 % — ABNORMAL LOW (ref 39.0–52.0)
Hemoglobin: 12.9 g/dL — ABNORMAL LOW (ref 13.0–17.0)
Immature Granulocytes: 0 %
Lymphocytes Relative: 22 %
Lymphs Abs: 1.9 K/uL (ref 0.7–4.0)
MCH: 28.7 pg (ref 26.0–34.0)
MCHC: 33.3 g/dL (ref 30.0–36.0)
MCV: 86.2 fL (ref 80.0–100.0)
Monocytes Absolute: 0.4 K/uL (ref 0.1–1.0)
Monocytes Relative: 4 %
Neutro Abs: 6.4 K/uL (ref 1.7–7.7)
Neutrophils Relative %: 72 %
Platelets: 237 K/uL (ref 150–400)
RBC: 4.49 MIL/uL (ref 4.22–5.81)
RDW: 12.7 % (ref 11.5–15.5)
WBC: 8.9 K/uL (ref 4.0–10.5)
nRBC: 0 % (ref 0.0–0.2)

## 2024-07-12 LAB — RESP PANEL BY RT-PCR (RSV, FLU A&B, COVID)  RVPGX2
Influenza A by PCR: NEGATIVE
Influenza B by PCR: NEGATIVE
Resp Syncytial Virus by PCR: NEGATIVE
SARS Coronavirus 2 by RT PCR: NEGATIVE

## 2024-07-12 LAB — COMPREHENSIVE METABOLIC PANEL WITH GFR
ALT: 24 U/L (ref 0–44)
AST: 18 U/L (ref 15–41)
Albumin: 3.4 g/dL — ABNORMAL LOW (ref 3.5–5.0)
Alkaline Phosphatase: 205 U/L — ABNORMAL HIGH (ref 38–126)
Anion gap: 13 (ref 5–15)
BUN: 10 mg/dL (ref 6–20)
CO2: 23 mmol/L (ref 22–32)
Calcium: 8.7 mg/dL — ABNORMAL LOW (ref 8.9–10.3)
Chloride: 97 mmol/L — ABNORMAL LOW (ref 98–111)
Creatinine, Ser: 0.94 mg/dL (ref 0.61–1.24)
GFR, Estimated: >60 mL/min (ref >60)
Glucose, Bld: 492 mg/dL — ABNORMAL HIGH (ref 70–99)
Potassium: 3.5 mmol/L (ref 3.5–5.1)
Sodium: 133 mmol/L — ABNORMAL LOW (ref 135–145)
Total Bilirubin: 0.7 mg/dL (ref 0.0–1.2)
Total Protein: 6.9 g/dL (ref 6.5–8.1)

## 2024-07-12 LAB — BRAIN NATRIURETIC PEPTIDE: B Natriuretic Peptide: 36 pg/mL (ref 0.0–100.0)

## 2024-07-12 LAB — D-DIMER, QUANTITATIVE: D-Dimer, Quant: 0.28 ug{FEU}/mL (ref 0.00–0.50)

## 2024-07-12 LAB — LIPASE, BLOOD: Lipase: 28 U/L (ref 11–51)

## 2024-07-12 LAB — CBG MONITORING, ED: Glucose-Capillary: 351 mg/dL — ABNORMAL HIGH (ref 70–99)

## 2024-07-12 MED ORDER — IBUPROFEN 400 MG PO TABS: 600.0000 mg | ORAL_TABLET | Freq: Once | ORAL | Status: DC

## 2024-07-12 MED ORDER — INSULIN ASPART 100 UNIT/ML IJ SOLN
7.0000 [IU] | Freq: Once | INTRAMUSCULAR | Status: AC
Start: 2024-07-12 — End: 2024-07-13
  Administered 2024-07-13: 7 [IU] via SUBCUTANEOUS
  Filled 2024-07-12: qty 1

## 2024-07-12 MED ORDER — KETOROLAC TROMETHAMINE 30 MG/ML IJ SOLN
30.0000 mg | Freq: Once | INTRAMUSCULAR | Status: AC
  Administered 2024-07-13: 30 mg via INTRAVENOUS
  Filled 2024-07-12: qty 1

## 2024-07-12 MED ORDER — ACETAMINOPHEN 500 MG PO TABS
1000.0000 mg | ORAL_TABLET | Freq: Once | ORAL | Status: AC
  Administered 2024-07-13: 1000 mg via ORAL
  Filled 2024-07-12: qty 2

## 2024-07-12 MED ORDER — LACTATED RINGERS IV BOLUS: 1000.0000 mL | Freq: Once | INTRAVENOUS | Status: DC

## 2024-07-12 NOTE — ED Provider Notes (Signed)
 [place] EMERGENCY DEPARTMENT AT [facility] Provider Note   CSN: [id number] Arrival date & time: 07/12/24  1543     History {Add pertinent medical, surgical, social history, OB history to HPI:1} Chief Complaint  Patient presents with  . Shortness of Breath    Larry Moody is a 53 y.o. male with PMH as listed below who presents POV c/o persistent cough, feeling SOB on exertion since Monday night. Pt reports trying natural remedies w/o relief. Pt reports chills, productive cough, body aches and reports difficulty breathing is worse at night.    Past Medical History:  Diagnosis Date  . Diabetes mellitus without complication (HCC)   . Hyperlipidemia   . Hypertension        Home Medications Prior to Admission medications   Medication Sig Start Date End Date Taking? Authorizing Provider  atorvastatin  (LIPITOR) 10 MG tablet Take 1 tablet (10 mg total) by mouth daily. 05/31/24   Del Orbe Polanco, Iliana, FNP  Blood Glucose Monitoring Suppl DEVI 1 each by Does not apply route in the morning, at noon, and at bedtime. May substitute to any manufacturer covered by patient's insurance. 09/21/23   Del Orbe Polanco, Iliana, FNP  Cholecalciferol (VITAMIN D3) 50 MCG (2000 UT) capsule Take 1 capsule (2,000 Units total) by mouth daily. 07/27/23   Del Wilhelmena Lloyd Sola, FNP  Continuous Glucose Receiver (DEXCOM G7 RECEIVER) DEVI Use as directed 07/27/23   Del Wilhelmena Lloyd Sola, FNP  Continuous Glucose Sensor (DEXCOM G7 SENSOR) MISC Use as directed to check blood sugar. DX e11.65 01/20/24   Del Wilhelmena Lloyd Sola, FNP  glipiZIDE  (GLUCOTROL ) 5 MG tablet Take 0.5 tablets (2.5 mg total) by mouth 2 (two) times daily before a meal. 05/31/24   Del Wilhelmena Lloyd, Ramsey, FNP  hydrOXYzine  (VISTARIL ) 25 MG capsule Take 1 capsule (25 mg total) by mouth every 8 (eight) hours as needed. Patient not taking: Reported on 01/20/2024 05/31/23   Del Wilhelmena Lloyd Sola, FNP  Lancets [facility] DELICA  PLUS [place]) MISC Apply 1 each topically 3 (three) times daily. 06/03/23   [provider]  nicotine  (NICODERM CQ  - DOSED IN MG/24 HR) 7 mg/24hr patch Place 1 patch (7 mg total) onto the skin daily. 05/26/24   Del Orbe Polanco, Sola, FNP  olmesartan  (BENICAR ) 40 MG tablet TAKE 1 TABLET BY MOUTH EVERY DAY 07/04/24   Del Orbe Polanco, Sola, FNP  tirzepatide  (MOUNJARO ) 10 MG/0.5ML Pen Inject 10 mg into the skin once a week. 05/31/24   Del Orbe Polanco, Iliana, FNP      Allergies    Other    Review of Systems   Review of Systems A 10 point review of systems was performed and is negative unless otherwise reported in HPI.  Physical Exam Updated Vital Signs BP 124/62   Pulse (!) 101   Temp 98.1 F (36.7 C) (Oral)   Resp 16   Ht 6' 4 (1.93 m)   Wt (!) 146.5 kg   SpO2 96%   BMI 39.31 kg/m  Physical Exam General: Normal appearing male, lying in bed.  HEENT: PERRLA, Sclera anicteric, MMM, trachea midline.  Cardiology: RRR, no murmurs/rubs/gallops. BL radial and DP pulses equal bilaterally.  Resp: Normal respiratory rate and effort. CTAB, no wheezes, rhonchi, crackles.  Abd: Soft, non-tender, non-distended. No rebound tenderness or guarding.  GU: Deferred. MSK: No peripheral edema or signs of trauma. Extremities without deformity or TTP. No cyanosis or clubbing. Skin: warm, dry. No rashes or lesions. Back:  No CVA tenderness Neuro: A&Ox4, CNs II-XII grossly intact. MAEs. Sensation grossly intact.  Psych: Normal mood and affect.   ED Results / Procedures / Treatments   Labs (all labs ordered are listed, but only abnormal results are displayed) Labs Reviewed  CBC WITH DIFFERENTIAL/PLATELET - Abnormal; Notable for the following components:      Result Value   Hemoglobin 12.9 (*)    HCT 38.7 (*)    All other components within normal limits  COMPREHENSIVE METABOLIC PANEL WITH GFR - Abnormal; Notable for the following components:   Sodium 133 (*)    Chloride 97 (*)     Glucose, Bld 492 (*)    Calcium  8.7 (*)    Albumin 3.4 (*)    Alkaline Phosphatase 205 (*)    All other components within normal limits  RESP PANEL BY RT-PCR (RSV, FLU A&B, COVID)  RVPGX2  LIPASE, BLOOD    EKG None  Radiology DG Chest 2 View Result Date: 07/12/2024 CLINICAL DATA:  Persistent cough, short of breath since Monday EXAM: CHEST - 2 VIEW COMPARISON:  None Available. FINDINGS: Frontal and lateral views of the chest demonstrate an unremarkable cardiac silhouette. No airspace disease, effusion, or pneumothorax. No acute bony abnormalities. IMPRESSION: 1. No acute intrathoracic process. Electronically Signed   By: Ozell Daring M.D.   On: 07/12/2024 16:16    Procedures Procedures  {Document cardiac monitor, telemetry assessment procedure when appropriate:1}  Medications Ordered in ED Medications - No data to display  ED Course/ Medical Decision Making/ A&P                          Medical Decision Making Amount and/or Complexity of Data Reviewed Labs: ordered. Radiology: ordered.    This patient presents to the ED for concern of ***, this involves an extensive number of treatment options, and is a complaint that carries with it a high risk of complications and morbidity.  I considered the following differential and admission for this acute, potentially life threatening condition.   MDM:    ***  Clinical Course as of 07/12/24 2046  Wed Jul 12, 2024  2045 Glucose(!): 492 Hyperglycemia [HN]  2046 DG Chest 2 View 1. No acute intrathoracic process. [HN]  2046 Resp panel by RT-PCR (RSV, Flu A&B, Covid) Anterior Nasal Swab neg [HN]    Clinical Course User Index [HN] Franklyn Sid SAILOR, MD    Labs: I Ordered, and personally interpreted labs.  The pertinent results include:  ***  Imaging Studies ordered: I ordered imaging studies including *** I independently visualized and interpreted imaging. I agree with the radiologist interpretation  Additional history  obtained from ***.  External records from outside source obtained and reviewed including ***  Cardiac Monitoring: .The patient was maintained on a cardiac monitor.  I personally viewed and interpreted the cardiac monitored which showed an underlying rhythm of: ***  Reevaluation: After the interventions noted above, I reevaluated the patient and found that they have :{resolved/improved/worsened:23923::improved}  Social Determinants of Health: .***  Disposition:  ***  Co morbidities that complicate the patient evaluation . Past Medical History:  Diagnosis Date  . Diabetes mellitus without complication (HCC)   . Hyperlipidemia   . Hypertension      Medicines No orders of the defined types were placed in this encounter.   I have reviewed the patients home medicines and have made adjustments as needed  Problem List / ED Course: Problem List Items Addressed This  Visit   None        {Document critical care time when appropriate:1} {Document review of labs and clinical decision tools ie heart score, Chads2Vasc2 etc:1}  {Document your independent review of radiology images, and any outside records:1} {Document your discussion with family members, caretakers, and with consultants:1} {Document social determinants of health affecting pt's care:1} {Document your decision making why or why not admission, treatments were needed:1}  This note was created using dictation software, which may contain spelling or grammatical errors.

## 2024-07-12 NOTE — ED Notes (Signed)
 Walked patient around the nurses desk oxygen stayed at 97% and heart rate 106

## 2024-07-12 NOTE — ED Triage Notes (Signed)
 Pt arrived via POV c/o persistent cough, feeling SOB on exertion since Monday night. Pt reports trying natural remedies w/o relief. Pt reports chills, productive cough, body aches and reports difficulty breathing is worse at night.

## 2024-07-13 ENCOUNTER — Emergency Department (HOSPITAL_COMMUNITY)

## 2024-07-13 DIAGNOSIS — R059 Cough, unspecified: Secondary | ICD-10-CM | POA: Diagnosis not present

## 2024-07-13 LAB — CBG MONITORING, ED: Glucose-Capillary: 314 mg/dL — ABNORMAL HIGH (ref 70–99)

## 2024-07-13 MED ORDER — AZITHROMYCIN 250 MG PO TABS: 250.0000 mg | ORAL_TABLET | Freq: Every day | ORAL | 0 refills | Status: DC

## 2024-07-13 MED ORDER — PREDNISONE 20 MG PO TABS
ORAL_TABLET | ORAL | 0 refills | Status: DC
Start: 2024-07-13 — End: 2024-09-15

## 2024-07-13 MED ORDER — PREDNISONE 50 MG PO TABS
60.0000 mg | ORAL_TABLET | Freq: Once | ORAL | Status: AC
  Administered 2024-07-13: 60 mg via ORAL
  Filled 2024-07-13: qty 1

## 2024-07-13 MED ORDER — IOHEXOL 350 MG/ML SOLN
100.0000 mL | Freq: Once | INTRAVENOUS | Status: AC | PRN
  Administered 2024-07-13: 100 mL via INTRAVENOUS

## 2024-07-13 MED ORDER — AZITHROMYCIN 250 MG PO TABS
500.0000 mg | ORAL_TABLET | Freq: Once | ORAL | Status: AC
  Administered 2024-07-13: 500 mg via ORAL
  Filled 2024-07-13: qty 2

## 2024-07-13 NOTE — ED Provider Notes (Signed)
 12:58 AM Assumed care from Dr. Franklyn, please see their note for full history, physical and decision making until this point. In brief this is a 53 y.o. year old male who presented to the ED tonight with Shortness of Breath     Dyspnea, cough, rhinorrhea, throat symptoms. Pending CTA. If negative then likely lower respiratory bronchitic type treatment.   CTA negative. HR improved to 70's. O2 is 97. Will treat as per previous plan.   Discharge instructions, including strict return precautions for new or worsening symptoms, given. Patient and/or family verbalized understanding and agreement with the plan as described.   Labs, studies and imaging reviewed by myself and considered in medical decision making if ordered. Imaging interpreted by radiology.  Labs Reviewed  CBC WITH DIFFERENTIAL/PLATELET - Abnormal; Notable for the following components:      Result Value   Hemoglobin 12.9 (*)    HCT 38.7 (*)    All other components within normal limits  COMPREHENSIVE METABOLIC PANEL WITH GFR - Abnormal; Notable for the following components:   Sodium 133 (*)    Chloride 97 (*)    Glucose, Bld 492 (*)    Calcium  8.7 (*)    Albumin 3.4 (*)    Alkaline Phosphatase 205 (*)    All other components within normal limits  CBG MONITORING, ED - Abnormal; Notable for the following components:   Glucose-Capillary 351 (*)    All other components within normal limits  CBG MONITORING, ED - Abnormal; Notable for the following components:   Glucose-Capillary 314 (*)    All other components within normal limits  RESP PANEL BY RT-PCR (RSV, FLU A&B, COVID)  RVPGX2  LIPASE, BLOOD  BRAIN NATRIURETIC PEPTIDE  D-DIMER, QUANTITATIVE    DG Chest 2 View  Final Result    CT Angio Chest PE W and/or Wo Contrast    (Results Pending)    No follow-ups on file.    Julieth Tugman, Selinda, MD 07/13/24 [phone]

## 2024-07-13 NOTE — ED Notes (Signed)
 Pt was given a sandwich and some water

## 2024-07-20 ENCOUNTER — Telehealth (INDEPENDENT_AMBULATORY_CARE_PROVIDER_SITE_OTHER): Payer: Self-pay | Admitting: Family Medicine

## 2024-07-20 DIAGNOSIS — Z09 Encounter for follow-up examination after completed treatment for conditions other than malignant neoplasm: Secondary | ICD-10-CM

## 2024-07-20 DIAGNOSIS — R7989 Other specified abnormal findings of blood chemistry: Secondary | ICD-10-CM | POA: Diagnosis not present

## 2024-07-20 DIAGNOSIS — R0602 Shortness of breath: Secondary | ICD-10-CM | POA: Diagnosis not present

## 2024-07-20 DIAGNOSIS — E878 Other disorders of electrolyte and fluid balance, not elsewhere classified: Secondary | ICD-10-CM | POA: Diagnosis not present

## 2024-07-20 MED ORDER — ALBUTEROL SULFATE HFA 108 (90 BASE) MCG/ACT IN AERS: 2.0000 | INHALATION_SPRAY | Freq: Four times a day (QID) | RESPIRATORY_TRACT | 2 refills | Status: AC | PRN

## 2024-07-20 NOTE — Progress Notes (Signed)
 Virtual Visit via Video Note  I connected with Larry Moody on 07/20/24 at  8:00 AM EDT by a video enabled telemedicine application and verified that I am speaking with the correct person using two identifiers.  Patient Location: Home Provider Location: Office/Clinic  I discussed the limitations, risks, security, and privacy concerns of performing an evaluation and management service by video and the availability of in person appointments. I also discussed with the patient that there may be a patient responsible charge related to this service. The patient expressed understanding and agreed to proceed.  Subjective: PCP: Terry Wilhelmena Lloyd Hilario, FNP  Chief Complaint  Patient presents with   Hospitalization Follow-up   HPI Patient presents via telehealth for ER discharge follow up. For the details of today's visit, please refer to assessment and plan.   ROS: Per HPI  Current Outpatient Medications:    albuterol  (VENTOLIN  HFA) 108 (90 Base) MCG/ACT inhaler, Inhale 2 puffs into the lungs every 6 (six) hours as needed for wheezing or shortness of breath., Disp: 8 g, Rfl: 2   atorvastatin  (LIPITOR) 10 MG tablet, Take 1 tablet (10 mg total) by mouth daily., Disp: 90 tablet, Rfl: 3   azithromycin  (ZITHROMAX ) 250 MG tablet, Take 1 tablet (250 mg total) by mouth daily. Take first 2 tablets together, then 1 every day until finished., Disp: 6 tablet, Rfl: 0   Blood Glucose Monitoring Suppl DEVI, 1 each by Does not apply route in the morning, at noon, and at bedtime. May substitute to any manufacturer covered by patient's insurance., Disp: 1 each, Rfl: 0   Cholecalciferol (VITAMIN D3) 50 MCG (2000 UT) capsule, Take 1 capsule (2,000 Units total) by mouth daily., Disp: 90 capsule, Rfl: 1   Continuous Glucose Receiver (DEXCOM G7 RECEIVER) DEVI, Use as directed, Disp: 1 each, Rfl: 1   Continuous Glucose Sensor (DEXCOM G7 SENSOR) MISC, Use as directed to check blood sugar. DX e11.65, Disp: 3 each,  Rfl: 5   glipiZIDE  (GLUCOTROL ) 5 MG tablet, Take 0.5 tablets (2.5 mg total) by mouth 2 (two) times daily before a meal., Disp: 90 tablet, Rfl: 3   hydrOXYzine  (VISTARIL ) 25 MG capsule, Take 1 capsule (25 mg total) by mouth every 8 (eight) hours as needed. (Patient not taking: Reported on 01/20/2024), Disp: 30 capsule, Rfl: 3   Lancets (ONETOUCH DELICA PLUS LANCET33G) MISC, Apply 1 each topically 3 (three) times daily., Disp: , Rfl:    nicotine  (NICODERM CQ  - DOSED IN MG/24 HR) 7 mg/24hr patch, Place 1 patch (7 mg total) onto the skin daily., Disp: 28 patch, Rfl: 0   olmesartan  (BENICAR ) 40 MG tablet, TAKE 1 TABLET BY MOUTH EVERY DAY, Disp: 90 tablet, Rfl: 2   predniSONE  (DELTASONE ) 20 MG tablet, 3 tabs po day one, then 2 po daily x 4 days, Disp: 11 tablet, Rfl: 0   tirzepatide  (MOUNJARO ) 10 MG/0.5ML Pen, Inject 10 mg into the skin once a week., Disp: 2 mL, Rfl: 2  Observations/Objective: There were no vitals filed for this visit. Physical Exam Patient is alert and no acute distress noted.   Assessment and Plan: Hospital discharge follow-up Assessment & Plan: Patient reports overall improvement but continues to experience occasional episodes of shortness of breath, occurring approximately 3-4 times per week. A chest X-ray has been ordered. Albuterol  inhaler prescribed for use as needed BMP and CBC labs ordered. The hospital chart, including the discharge summary, was thoroughly reviewed Medications were thoroughly reviewed and reconciled with the patient.  SOB (shortness of breath) -     DG Chest 2 View; Future -     Albuterol  Sulfate HFA; Inhale 2 puffs into the lungs every 6 (six) hours as needed for wheezing or shortness of breath.  Dispense: 8 g; Refill: 2  Electrolyte imbalance -     BMP8+eGFR  Abnormal CBC -     CBC with Differential/Platelet    Follow Up Instructions: No follow-ups on file.   I discussed the assessment and treatment plan with the patient. The patient was  provided an opportunity to ask questions, and all were answered. The patient agreed with the plan and demonstrated an understanding of the instructions.   The patient was advised to call back or seek an in-person evaluation if the symptoms worsen or if the condition fails to improve as anticipated.  The above assessment and management plan was discussed with the patient. The patient verbalized understanding of and has agreed to the management plan.   Larry Kidd Wilhelmena Falter, FNP

## 2024-07-20 NOTE — Assessment & Plan Note (Signed)
 Patient reports overall improvement but continues to experience occasional episodes of shortness of breath, occurring approximately 3-4 times per week. A chest X-ray has been ordered. Albuterol  inhaler prescribed for use as needed BMP and CBC labs ordered. The hospital chart, including the discharge summary, was thoroughly reviewed Medications were thoroughly reviewed and reconciled with the patient.

## 2024-09-08 ENCOUNTER — Other Ambulatory Visit: Payer: Self-pay | Admitting: Family Medicine

## 2024-09-15 ENCOUNTER — Ambulatory Visit

## 2024-09-15 VITALS — BP 158/84 | HR 99 | Ht 75.0 in | Wt 305.0 lb

## 2024-09-15 DIAGNOSIS — Z7985 Long-term (current) use of injectable non-insulin antidiabetic drugs: Secondary | ICD-10-CM

## 2024-09-15 DIAGNOSIS — Z7984 Long term (current) use of oral hypoglycemic drugs: Secondary | ICD-10-CM

## 2024-09-15 DIAGNOSIS — E119 Type 2 diabetes mellitus without complications: Secondary | ICD-10-CM

## 2024-09-15 MED ORDER — TIRZEPATIDE 10 MG/0.5ML ~~LOC~~ SOAJ: 10.0000 mg | SUBCUTANEOUS | 5 refills | Status: AC

## 2024-09-15 MED ORDER — GLIPIZIDE 5 MG PO TABS: 2.5000 mg | ORAL_TABLET | Freq: Two times a day (BID) | ORAL | 3 refills | Status: AC

## 2024-09-15 NOTE — Progress Notes (Signed)
 Established Patient Office Visit  Subjective   Patient ID: Larry Moody, male    DOB: [birth year]  Age: 53 y.o. MRN: [id number]  Chief Complaint  Patient presents with   Medical Management of Chronic Issues    Pt here for a follow up     HPI   Patient Active Problem List   Diagnosis Date Noted   Hospital discharge follow-up 07/20/2024   Toothache 07/27/2023   Insomnia 05/31/2023   Abdominal pain    Ileus (HCC) 08/31/2021   Essential hypertension 08/31/2021   Obesity 08/31/2021   AKI (acute kidney injury) 08/30/2021   Type 2 diabetes mellitus (HCC) 05/30/2019    ROS    Objective:     BP (!) 158/84   Pulse 99   Ht 6' 3 (1.905 m)   Wt (!) 305 lb (138.3 kg)   SpO2 93%   BMI 38.12 kg/m  BP Readings from Last 3 Encounters:  09/15/24 (!) 158/84  07/13/24 (!) 158/86  05/26/24 137/86   Wt Readings from Last 3 Encounters:  09/15/24 (!) 305 lb (138.3 kg)  07/12/24 (!) 322 lb 15.6 oz (146.5 kg)  05/26/24 (!) 323 lb (146.5 kg)      Physical Exam Vitals and nursing note reviewed.  Constitutional:      Appearance: Normal appearance.  HENT:     Head: Normocephalic.  Eyes:     Extraocular Movements: Extraocular movements intact.     Pupils: Pupils are equal, round, and reactive to light.  Cardiovascular:     Rate and Rhythm: Normal rate and regular rhythm.  Pulmonary:     Effort: Pulmonary effort is normal.     Breath sounds: Normal breath sounds.  Musculoskeletal:     Cervical back: Normal range of motion and neck supple.  Neurological:     Mental Status: He is alert and oriented to person, place, and time.  Psychiatric:        Mood and Affect: Mood normal.        Thought Content: Thought content normal.    Diabetic foot exam was performed with the following findings:   No deformities, ulcerations, or other skin breakdown Normal sensation of 10g monofilament Intact posterior tibialis and dorsalis pedis pulses     Results for orders placed or  performed in visit on 09/15/24  Bayer DCA Hb A1c Waived  Result Value Ref Range   HB A1C (BAYER DCA - WAIVED) 11.9 (H) 4.8 - 5.6 %    Last CBC Lab Results  Component Value Date   WBC 8.9 07/12/2024   HGB 12.9 (L) 07/12/2024   HCT 38.7 (L) 07/12/2024   MCV 86.2 07/12/2024   MCH 28.7 07/12/2024   RDW 12.7 07/12/2024   PLT 237 07/12/2024   Last metabolic panel Lab Results  Component Value Date   GLUCOSE 492 (H) 07/12/2024   NA 133 (L) 07/12/2024   K 3.5 07/12/2024   CL 97 (L) 07/12/2024   CO2 23 07/12/2024   BUN 10 07/12/2024   CREATININE 0.94 07/12/2024   GFRNONAA >60 07/12/2024   CALCIUM  8.7 (L) 07/12/2024   PROT 6.9 07/12/2024   ALBUMIN 3.4 (L) 07/12/2024   LABGLOB 2.8 12/02/2020   AGRATIO 1.5 12/02/2020   BILITOT 0.7 07/12/2024   ALKPHOS 205 (H) 07/12/2024   AST 18 07/12/2024   ALT 24 07/12/2024   ANIONGAP 13 07/12/2024   Last lipids Lab Results  Component Value Date   CHOL 165 05/26/2024   HDL 41 05/26/2024  LDLCALC 102 (H) 05/26/2024   TRIG 121 05/26/2024   CHOLHDL 4.0 05/26/2024   Last hemoglobin A1c Lab Results  Component Value Date   HGBA1C 11.9 (H) 09/15/2024   Last thyroid  functions Lab Results  Component Value Date   TSH 1.230 05/26/2024   FREET4 1.04 05/26/2024   THYROIDAB <9 07/27/2023   Last vitamin D  Lab Results  Component Value Date   VD25OH 22.6 (L) 01/20/2024   Last vitamin B12 and Folate Lab Results  Component Value Date   VITAMINB12 612 01/20/2024     The 10-year ASCVD risk score (Arnett DK, et al., 2019) is: 28.3%    Assessment & Plan:   Problem List Items Addressed This Visit       Endocrine   Type 2 diabetes mellitus (HCC) - Primary   Hemoglobin A1c in office today: 11.9 Labs: Ordered today, results pending; will follow up accordingly. The patient reports that he has been out of his Mounjaro  and glipizide  for about a month.  Refills provided today.    Reviewed non-pharmacological interventions, including a  balanced diet rich in lean proteins, healthy fats, whole grains, and high-fiber vegetables. Emphasized reducing refined sugars and processed carbohydrates, and incorporating more fruits, leafy greens, and legumes. Education: Patient was educated on recognizing signs and symptoms of both hypoglycemia and hyperglycemia, and advised to seek emergency care if these symptoms occur. Follow-Up: Scheduled for follow-up in 3-4 months, or sooner if needed. Patient Understanding: The patient verbalized understanding of the care plan, and all questions were answered.       Relevant Medications   glipiZIDE  (GLUCOTROL ) 5 MG tablet   tirzepatide  (MOUNJARO ) 10 MG/0.5ML Pen   Other Relevant Orders   Bayer DCA Hb A1c Waived (Completed)    Return in about 3 months (around 12/16/2024) for chronic follow-up with PCP.    Leita Longs, FNP

## 2024-09-19 LAB — BAYER DCA HB A1C WAIVED: HB A1C (BAYER DCA - WAIVED): 11.9 % — ABNORMAL HIGH (ref 4.8–5.6)

## 2024-09-19 NOTE — Assessment & Plan Note (Addendum)
 Hemoglobin A1c in office today: 11.9 Labs: Ordered today, results pending; will follow up accordingly. The patient reports that he has been out of his Mounjaro  and glipizide  for about a month.  Refills provided today.    Reviewed non-pharmacological interventions, including a balanced diet rich in lean proteins, healthy fats, whole grains, and high-fiber vegetables. Emphasized reducing refined sugars and processed carbohydrates, and incorporating more fruits, leafy greens, and legumes. Education: Patient was educated on recognizing signs and symptoms of both hypoglycemia and hyperglycemia, and advised to seek emergency care if these symptoms occur. Follow-Up: Scheduled for follow-up in 3-4 months, or sooner if needed. Patient Understanding: The patient verbalized understanding of the care plan, and all questions were answered. Diabetic foot exam completed today.
